# Patient Record
Sex: Male | Born: 1940
Health system: Southern US, Community
[De-identification: ages and names within clinical notes are randomized; demographics above are authoritative.]

## PROBLEM LIST (undated history)

## (undated) DIAGNOSIS — Z86010 Personal history of colonic polyps: Secondary | ICD-10-CM

## (undated) DIAGNOSIS — K219 Gastro-esophageal reflux disease without esophagitis: Secondary | ICD-10-CM

## (undated) DIAGNOSIS — G4733 Obstructive sleep apnea (adult) (pediatric): Secondary | ICD-10-CM

## (undated) DIAGNOSIS — M1990 Unspecified osteoarthritis, unspecified site: Secondary | ICD-10-CM

## (undated) DIAGNOSIS — E291 Testicular hypofunction: Secondary | ICD-10-CM

## (undated) DIAGNOSIS — B009 Herpesviral infection, unspecified: Secondary | ICD-10-CM

## (undated) DIAGNOSIS — M109 Gout, unspecified: Secondary | ICD-10-CM

## (undated) DIAGNOSIS — R739 Hyperglycemia, unspecified: Secondary | ICD-10-CM

## (undated) DIAGNOSIS — G459 Transient cerebral ischemic attack, unspecified: Secondary | ICD-10-CM

## (undated) DIAGNOSIS — Z8719 Personal history of other diseases of the digestive system: Secondary | ICD-10-CM

## (undated) DIAGNOSIS — E039 Hypothyroidism, unspecified: Secondary | ICD-10-CM

## (undated) DIAGNOSIS — R399 Unspecified symptoms and signs involving the genitourinary system: Secondary | ICD-10-CM

## (undated) DIAGNOSIS — J439 Emphysema, unspecified: Secondary | ICD-10-CM

## (undated) DIAGNOSIS — Z5189 Encounter for other specified aftercare: Secondary | ICD-10-CM

## (undated) DIAGNOSIS — R911 Solitary pulmonary nodule: Secondary | ICD-10-CM

## (undated) HISTORY — DX: Gastro-esophageal reflux disease without esophagitis: K21.9

## (undated) HISTORY — DX: Testicular hypofunction: E29.1

## (undated) HISTORY — DX: Solitary pulmonary nodule: R91.1

## (undated) HISTORY — DX: Hypothyroidism, unspecified: E03.9

## (undated) HISTORY — DX: Herpesviral infection, unspecified: B00.9

## (undated) HISTORY — DX: Obstructive sleep apnea (adult) (pediatric): G47.33

## (undated) HISTORY — DX: Emphysema, unspecified: J43.9

## (undated) HISTORY — DX: Transient cerebral ischemic attack, unspecified: G45.9

## (undated) HISTORY — DX: Encounter for other specified aftercare: Z51.89

## (undated) HISTORY — DX: Hyperglycemia, unspecified: R73.9

## (undated) HISTORY — DX: Unspecified symptoms and signs involving the genitourinary system: R39.9

## (undated) HISTORY — DX: Gout, unspecified: M10.9

## (undated) HISTORY — DX: Unspecified osteoarthritis, unspecified site: M19.90

## (undated) HISTORY — DX: Personal history of other diseases of the digestive system: Z87.19

## (undated) HISTORY — DX: Personal history of colonic polyps: Z86.010

---

## 2014-11-27 LAB — HM COLONOSCOPY

## 2015-02-13 LAB — PULMONARY FUNCTION TEST

## 2015-07-05 LAB — HM DEXA SCAN

## 2016-03-16 HISTORY — PX: HERNIA REPAIR: SHX51

## 2016-06-10 LAB — PSA: PSA: 1.3

## 2016-09-28 LAB — IRON,TIBC AND FERRITIN PANEL
%SAT: 21
Ferritin: 74
Iron: 67
TIBC: 323

## 2016-09-28 LAB — VITAMIN D 25 HYDROXY (VIT D DEFICIENCY, FRACTURES): VIT D 25 HYDROXY: 30

## 2017-02-10 LAB — BASIC METABOLIC PANEL
BUN: 15 (ref 4–21)
Creatinine: 1 (ref <=1.3)
Glucose: 102
Potassium: 4.1 (ref 3.4–5.3)
SODIUM: 139 (ref 137–147)

## 2017-02-10 LAB — HEPATIC FUNCTION PANEL
ALT: 26 (ref 10–40)
AST: 27 (ref 14–40)
Alkaline Phosphatase: 61 (ref 25–125)
Bilirubin, Total: 0.9

## 2017-05-21 LAB — LIPID PANEL
CHOLESTEROL: 159 (ref 0–200)
HDL: 47 (ref 35–70)
LDL Cholesterol: 81
LDL/HDL RATIO: 1.7
Triglycerides: 155 (ref 40–160)

## 2017-05-21 LAB — CBC AND DIFFERENTIAL
HCT: 49 (ref 41–53)
HEMOGLOBIN: 16.5 (ref 13.5–17.5)
PLATELETS: 135 — AB (ref 150–399)
WBC: 5.5

## 2017-05-21 LAB — TSH: TSH: 0.37 — AB (ref <=5.90)

## 2017-10-08 ENCOUNTER — Telehealth: Payer: Self-pay

## 2017-10-08 NOTE — Telephone Encounter (Signed)
I approved this new Pt- his sister is Abe PeopleBarbara Rosetti- Dr. Drue NovelPaz accepting family members.

## 2017-10-19 ENCOUNTER — Ambulatory Visit (INDEPENDENT_AMBULATORY_CARE_PROVIDER_SITE_OTHER): Payer: Medicare HMO | Admitting: Internal Medicine

## 2017-10-19 ENCOUNTER — Encounter: Payer: Self-pay | Admitting: Internal Medicine

## 2017-10-19 VITALS — BP 116/74 | HR 79 | Temp 97.9°F | Resp 16 | Ht 72.0 in | Wt 252.1 lb

## 2017-10-19 DIAGNOSIS — E039 Hypothyroidism, unspecified: Secondary | ICD-10-CM

## 2017-10-19 DIAGNOSIS — R399 Unspecified symptoms and signs involving the genitourinary system: Secondary | ICD-10-CM

## 2017-10-19 DIAGNOSIS — R918 Other nonspecific abnormal finding of lung field: Secondary | ICD-10-CM

## 2017-10-19 DIAGNOSIS — R739 Hyperglycemia, unspecified: Secondary | ICD-10-CM

## 2017-10-19 DIAGNOSIS — I499 Cardiac arrhythmia, unspecified: Secondary | ICD-10-CM | POA: Diagnosis not present

## 2017-10-19 DIAGNOSIS — R079 Chest pain, unspecified: Secondary | ICD-10-CM | POA: Diagnosis not present

## 2017-10-19 DIAGNOSIS — N433 Hydrocele, unspecified: Secondary | ICD-10-CM

## 2017-10-19 LAB — COMPREHENSIVE METABOLIC PANEL
ALBUMIN: 4.2 g/dL (ref 3.5–5.2)
ALK PHOS: 59 U/L (ref 39–117)
ALT: 19 U/L (ref 0–53)
AST: 19 U/L (ref 0–37)
BUN: 19 mg/dL (ref 6–23)
CHLORIDE: 105 meq/L (ref 96–112)
CO2: 30 mEq/L (ref 19–32)
Calcium: 9.4 mg/dL (ref 8.4–10.5)
Creatinine, Ser: 1.04 mg/dL (ref 0.40–1.50)
GFR: 73.97 mL/min (ref >60.00)
Glucose, Bld: 109 mg/dL — ABNORMAL HIGH (ref 70–99)
POTASSIUM: 3.9 meq/L (ref 3.5–5.1)
Sodium: 143 mEq/L (ref 135–145)
Total Bilirubin: 0.9 mg/dL (ref 0.2–1.2)
Total Protein: 6.5 g/dL (ref 6.0–8.3)

## 2017-10-19 LAB — CBC WITH DIFFERENTIAL/PLATELET
BASOS PCT: 0.6 % (ref 0.0–3.0)
Basophils Absolute: 0 10*3/uL (ref 0.0–0.1)
EOS PCT: 1 % (ref 0.0–5.0)
Eosinophils Absolute: 0.1 10*3/uL (ref 0.0–0.7)
HEMATOCRIT: 46.5 % (ref 39.0–52.0)
HEMOGLOBIN: 15.6 g/dL (ref 13.0–17.0)
Lymphocytes Relative: 29.4 % (ref 12.0–46.0)
Lymphs Abs: 1.6 10*3/uL (ref 0.7–4.0)
MCHC: 33.6 g/dL (ref 30.0–36.0)
MCV: 95.4 fl (ref 78.0–100.0)
MONOS PCT: 6.5 % (ref 3.0–12.0)
Monocytes Absolute: 0.3 10*3/uL (ref 0.1–1.0)
Neutro Abs: 3.4 10*3/uL (ref 1.4–7.7)
Neutrophils Relative %: 62.5 % (ref 43.0–77.0)
Platelets: 126 10*3/uL — ABNORMAL LOW (ref 150.0–400.0)
RBC: 4.88 Mil/uL (ref 4.22–5.81)
RDW: 14.6 % (ref 11.5–15.5)
WBC: 5.4 10*3/uL (ref 4.0–10.5)

## 2017-10-19 LAB — HEMOGLOBIN A1C: Hgb A1c MFr Bld: 6.3 % (ref 4.6–6.5)

## 2017-10-19 LAB — TSH: TSH: 0.11 u[IU]/mL — ABNORMAL LOW (ref 0.35–4.50)

## 2017-10-19 NOTE — Patient Instructions (Signed)
GO TO THE LAB : Get the blood work     GO TO THE FRONT DESK Schedule your next appointment for a checkup in 4 weeks  ER if severe chest pain

## 2017-10-19 NOTE — Progress Notes (Signed)
Pre visit review using our clinic review tool, if applicable. No additional management support is needed unless otherwise documented below in the visit note. 

## 2017-10-19 NOTE — Progress Notes (Signed)
Subjective:    Patient ID: Samuel Frey, male    DOB: 06-19-1940, 77 y.o.   MRN: [id number]  DOS:  10/19/2017 Type of visit - description : New patient Interval history: The patient just moved from [place], he has a number of medical problems. We discussed each one of his chronic medical issues.   Review of Systems Currently with no shortness of breath, occasionally has mild DOE. He has chest pain for the last several months, the last time he had chest pain was few days ago when he was loading his truck.  The pain is right-sided, decreased with nitroglycerin. He is a former smoker, denies persistent cough. No nausea, vomiting, diarrhea Has history of LUTS, currently well controlled with medications. Denies anxiety or depression.   Past Medical History:  Diagnosis Date  . Arthritis   . Blood transfusion without reported diagnosis   . Emphysema of lung (HCC)   . GERD (gastroesophageal reflux disease)   . Hx of colonic polyps   . Hx of diverticulitis of colon   . Stroke (HCC)   . Thyroid disease       Social History   Socioeconomic History  . Marital status: Single    Spouse name: Not on file  . Number of children: 0  . Years of education: Not on file  . Highest education level: Not on file  Occupational History  . Occupation: retired, Museum/gallery conservator  . Financial resource strain: Not on file  . Food insecurity:    Worry: Not on file    Inability: Not on file  . Transportation needs:    Medical: Not on file    Non-medical: Not on file  Tobacco Use  . Smoking status: Former Smoker    Last attempt to quit: 10/19/2012    Years since quitting: 5.0  . Smokeless tobacco: Never Used  . Tobacco comment: 1-2 ppd  Substance and Sexual Activity  . Alcohol use: Yes    Comment: Social- 2 drinks per day normally  . Drug use: Never  . Sexual activity: Not Currently  Lifestyle  . Physical activity:    Days per week: Not on file    Minutes per  session: Not on file  . Stress: Not on file  Relationships  . Social connections:    Talks on phone: Not on file    Gets together: Not on file    Attends religious service: Not on file    Active member of club or organization: Not on file    Attends meetings of clubs or organizations: Not on file    Relationship status: Not on file  . Intimate partner violence:    Fear of current or ex partner: Not on file    Emotionally abused: Not on file    Physically abused: Not on file    Forced sexual activity: Not on file  Other Topics Concern  . Not on file  Social History Narrative   Moved from [place] to [place], 09/2017   Sister is Christain [place]      Family History  Problem Relation Age of Onset  . COPD Father   . CAD Maternal Grandfather   . Colon cancer Neg Hx   . Prostate cancer Neg Hx      Allergies as of 10/19/2017   No Known Allergies     Medication List        Accurate as of 10/19/17  6:03 PM. Always use your most recent med  list.          alfuzosin 10 MG 24 hr tablet Commonly known as:  UROXATRAL Take 10 mg by mouth daily with breakfast.   allopurinol 300 MG tablet Commonly known as:  ZYLOPRIM Take 300 mg by mouth daily.   clonazePAM 1 MG tablet Commonly known as:  KLONOPIN Take 1 mg by mouth daily.   clopidogrel 75 MG tablet Commonly known as:  PLAVIX Take 75 mg by mouth daily.   gabapentin 300 MG capsule Commonly known as:  NEURONTIN Take 300 mg by mouth as needed.   levothyroxine 150 MCG tablet Commonly known as:  SYNTHROID, LEVOTHROID Take 150 mcg by mouth daily before breakfast.   pantoprazole 20 MG tablet Commonly known as:  PROTONIX Take 20 mg by mouth 2 (two) times daily before a meal.   simvastatin 40 MG tablet Commonly known as:  ZOCOR Take 40 mg by mouth daily.          Objective:   Physical Exam BP 116/74 (BP Location: Left Arm, Patient Position: Sitting, Cuff Size: Normal)   Pulse 79   Temp 97.9 F (36.6 C) (Oral)   Resp  16   Ht 6' (1.829 m)   Wt 252 lb 2 oz (114.4 kg)   SpO2 98%   BMI 34.19 kg/m  General:   Well developed, NAD, see BMI.  HEENT:  Normocephalic . Face symmetric, atraumatic Lungs:  CTA B Normal respiratory effort, no intercostal retractions, no accessory muscle use. Heart: regular?,  no murmur.  no pretibial edema bilaterally  Abdomen:  Not distended, soft, non-tender. No rebound or rigidity.   GU: scrotal exam showed two testicles, L side of scrotum w/ soft,non-reducible swelling. No inguinal hernia Skin: Not pale. Not jaundice Neurologic:  alert & oriented X3.  Speech normal, gait appropriate for age and unassisted Psych--  Cognition and judgment appear intact.  Cooperative with normal attention span and concentration.  Behavior appropriate. No anxious or depressed appearing.     Assessment & Plan:   Assessment TIA ~ 2017 CP: low risk stress test ~ 05/2017 Gout Hypothyroidism  Hyperlipidemia GERD LUTS: after several intolerances to meds now on Alfuzosin Hypogonadism  PULM: --COPD --Lung nodules  MSK --DJD -- Cervical Spinal stenosis Nasal nodule/polyps (saw ENT, had a endoscopy~ 2-05/2017) L hernia repair ~ 2018, still hurts   Plan: TIA: Remote, on Plavix. Chest pain: Patient has chest pain on and off, right-sided, exertional at times, decreased with nitroglycerin.  Reports he had a low risk stress test recently.  Still has occasional chest pain. On exam, I felt irregular heartbeat, EKG showed sinus rhythm with frequent ectopy.  No A. fib.  No EKG to compare with. Gout: On allopurinol.  No recent events Hypothyroidism: On Synthroid, checking a TSH Hyperlipidemia: On simvastatin, checking labs LUTS: After several trial with different medications, symptoms are currently controlled will alfuzosin. Hypogonadism: Patient reports he stopped HRT about 4 weeks ago, he felt it was not he helping him clinically, did not feel stronger, libido did not change. COPD: No  symptoms currently. History of lung nodules: Reports that he has been getting CAT scans every 6 months, last CAT scan was 08/2017 and he was told he was stable. Scrotal swelling: Had a left hernia repair about a year ago, at the time the scrotum on the left side was swollen.  After the surgery that resolved but has gradually come back.  Exam is confirmatory, suspect hydrocele,  reassess on RTC, consider urology referral. Labs today:  CMP, TSH, CBC, A1c (r/o hyperglycemia) Patient reports he already signed ROI.  Will wait for old records RTC 4 weeks  Today, I spent more than  40  min with the patient: >50% of the time counseling regards all previous med problems, pt gave a detail description of his PMH

## 2017-10-21 MED ORDER — LEVOTHYROXINE SODIUM 125 MCG PO TABS: 125.0000 ug | ORAL_TABLET | Freq: Every day | ORAL | 0 refills | Status: DC

## 2017-10-21 NOTE — Addendum Note (Signed)
Addended byConrad [place]: Sewell Pitner D on: 10/21/2017 02:32 PM   Modules accepted: Orders

## 2017-11-17 ENCOUNTER — Ambulatory Visit (INDEPENDENT_AMBULATORY_CARE_PROVIDER_SITE_OTHER): Payer: Medicare HMO | Admitting: Internal Medicine

## 2017-11-17 ENCOUNTER — Telehealth: Payer: Self-pay

## 2017-11-17 ENCOUNTER — Encounter: Payer: Self-pay | Admitting: Internal Medicine

## 2017-11-17 VITALS — BP 121/68 | HR 93 | Temp 98.4°F | Resp 16 | Ht 72.0 in | Wt 258.0 lb

## 2017-11-17 DIAGNOSIS — M1990 Unspecified osteoarthritis, unspecified site: Secondary | ICD-10-CM

## 2017-11-17 DIAGNOSIS — G459 Transient cerebral ischemic attack, unspecified: Secondary | ICD-10-CM

## 2017-11-17 DIAGNOSIS — R739 Hyperglycemia, unspecified: Secondary | ICD-10-CM | POA: Diagnosis not present

## 2017-11-17 DIAGNOSIS — Z09 Encounter for follow-up examination after completed treatment for conditions other than malignant neoplasm: Secondary | ICD-10-CM | POA: Insufficient documentation

## 2017-11-17 DIAGNOSIS — E039 Hypothyroidism, unspecified: Secondary | ICD-10-CM

## 2017-11-17 DIAGNOSIS — J439 Emphysema, unspecified: Secondary | ICD-10-CM

## 2017-11-17 DIAGNOSIS — G2581 Restless legs syndrome: Secondary | ICD-10-CM

## 2017-11-17 DIAGNOSIS — R399 Unspecified symptoms and signs involving the genitourinary system: Secondary | ICD-10-CM

## 2017-11-17 LAB — TSH: TSH: 0.25 u[IU]/mL — ABNORMAL LOW (ref 0.35–4.50)

## 2017-11-17 MED ORDER — CLONAZEPAM 1 MG PO TABS: 1.0000 mg | ORAL_TABLET | Freq: Every day | ORAL | 1 refills | Status: DC

## 2017-11-17 NOTE — Assessment & Plan Note (Signed)
Prediabetes: Diagnosed since the last visit with A1c of 6.3.  Patient aware of diagnosis, we discussed diet and exercise Hypothyroidism: Small adjustment to dose made 4 weeks ago, is a little early but will check a TSH Blister, lip as described above: Does not sound like herpes, cyst?  Recommend to get established with a dentist. RLS: Reports he takes clonazepam as needed, RF sent Records are still pending, new ROI obtained. Scrotal swelling: Reassess on RTC RTC 3 months

## 2017-11-17 NOTE — Telephone Encounter (Signed)
Noted, thx.
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
## 2017-11-17 NOTE — Progress Notes (Signed)
Subjective:    Patient ID: Samuel Frey, male    DOB: 1940/07/18, 77 y.o.   MRN: [id number]  DOS:  11/17/2017 Type of visit - description : f/u Interval history: Since the last office visit he is doing well. Good compliance with medication Has had on and off problem on the inner aspect of the lower lip since he had dental work few months ago.  Every 10 days he develops a blisterlike lesion.  Is a single blister, not really painful but it bothers him when it rubs against the teeth.  patient thinks is related to the dental work he had. Also reports today that he has RLS and request a refill for clonazepam  Review of Systems  Occasional chest pain, mild, at baseline   Past Medical History:  Diagnosis Date  . Arthritis   . Blood transfusion without reported diagnosis   . Emphysema of lung (HCC)   . GERD (gastroesophageal reflux disease)   . Hx of colonic polyps   . Hx of diverticulitis of colon   . Hyperglycemia   . Hypothyroidism   . Lower urinary tract symptoms (LUTS)   . TIA (transient ischemic attack)     Past Surgical History:  Procedure Laterality Date  . HERNIA REPAIR Left 2018    Social History   Socioeconomic History  . Marital status: Single    Spouse name: Not on file  . Number of children: 0  . Years of education: Not on file  . Highest education level: Not on file  Occupational History  . Occupation: retired, Museum/gallery conservator  . Financial resource strain: Not on file  . Food insecurity:    Worry: Not on file    Inability: Not on file  . Transportation needs:    Medical: Not on file    Non-medical: Not on file  Tobacco Use  . Smoking status: Former Smoker    Last attempt to quit: 10/19/2012    Years since quitting: 5.0  . Smokeless tobacco: Never Used  . Tobacco comment: 1-2 ppd  Substance and Sexual Activity  . Alcohol use: Yes    Comment: Social- 2 drinks per day normally  . Drug use: Never  . Sexual activity: Not Currently    Lifestyle  . Physical activity:    Days per week: Not on file    Minutes per session: Not on file  . Stress: Not on file  Relationships  . Social connections:    Talks on phone: Not on file    Gets together: Not on file    Attends religious service: Not on file    Active member of club or organization: Not on file    Attends meetings of clubs or organizations: Not on file    Relationship status: Not on file  . Intimate partner violence:    Fear of current or ex partner: Not on file    Emotionally abused: Not on file    Physically abused: Not on file    Forced sexual activity: Not on file  Other Topics Concern  . Not on file  Social History Narrative   Moved from [place] to [place], 09/2017   Sister is Christain [place]       Allergies as of 11/17/2017   No Known Allergies     Medication List        Accurate as of 11/17/17  9:34 PM. Always use your most recent med list.  alfuzosin 10 MG 24 hr tablet Commonly known as:  UROXATRAL Take 10 mg by mouth daily with breakfast.   allopurinol 300 MG tablet Commonly known as:  ZYLOPRIM Take 300 mg by mouth daily.   clonazePAM 1 MG tablet Commonly known as:  KLONOPIN Take 1 tablet (1 mg total) by mouth daily.   clopidogrel 75 MG tablet Commonly known as:  PLAVIX Take 75 mg by mouth daily.   gabapentin 300 MG capsule Commonly known as:  NEURONTIN Take 300 mg by mouth as needed.   levothyroxine 125 MCG tablet Commonly known as:  SYNTHROID, LEVOTHROID Take 1 tablet (125 mcg total) by mouth daily before breakfast.   pantoprazole 20 MG tablet Commonly known as:  PROTONIX Take 20 mg by mouth 2 (two) times daily before a meal.   simvastatin 40 MG tablet Commonly known as:  ZOCOR Take 40 mg by mouth daily.          Objective:   Physical Exam BP 121/68 (BP Location: Left Arm, Patient Position: Sitting, Cuff Size: Normal)   Pulse 93   Temp 98.4 F (36.9 C) (Oral)   Resp 16   Ht 6' (1.829 m)   Wt 258 lb  (117 kg)   SpO2 96%   BMI 34.99 kg/m  General:   Well developed, NAD, see BMI.  HEENT:  Normocephalic . Face symmetric, atraumatic.   Lower inner lip normal to inspection and palpation. Lungs:  CTA B Normal respiratory effort, no intercostal retractions, no accessory muscle use. Heart: RRR,  no murmur.  No pretibial edema bilaterally  Skin: Not pale. Not jaundice Neurologic:  alert & oriented X3.  Speech normal, gait appropriate for age and unassisted Psych--  Cognition and judgment appear intact.  Cooperative with normal attention span and concentration.  Behavior appropriate. No anxious or depressed appearing.       Assessment & Plan:   Assessment  New pt 10/19/17, referred by B. Rossetti Hyperglycemia: A1C 6.3 (10/2017) TIA ~ 2017 CP: low risk stress test ~ 05/2017 Gout Hypothyroidism  Hyperlipidemia RLS - on clonazepam prn GERD LUTS: after several intolerances to meds now on Alfuzosin Hypogonadism  PULM: --COPD --Lung nodules  MSK --DJD -- Cervical Spinal stenosis Nasal nodule/polyps (saw ENT, had a endoscopy~ 2-05/2017) L hernia repair ~ 2018, still hurts   Plan: Prediabetes: Diagnosed since the last visit with A1c of 6.3.  Patient aware of diagnosis, we discussed diet and exercise Hypothyroidism: Small adjustment to dose made 4 weeks ago, is a little early but will check a TSH Blister, lip as described above: Does not sound like herpes, cyst?  Recommend to get established with a dentist. RLS: Reports he takes clonazepam as needed, RF sent Records are still pending, new ROI obtained. Scrotal swelling: Reassess on RTC RTC 3 months

## 2017-11-17 NOTE — Patient Instructions (Signed)
GO TO THE LAB : Get the blood work     GO TO THE FRONT DESK Schedule your next appointment for a checkup in 3 months  Please get established with a dentist

## 2017-11-17 NOTE — Progress Notes (Signed)
Pre visit review using our clinic review tool, if applicable. No additional management support is needed unless otherwise documented below in the visit note. 

## 2017-11-19 MED ORDER — LEVOTHYROXINE SODIUM 112 MCG PO TABS: 112.0000 ug | ORAL_TABLET | Freq: Every day | ORAL | 1 refills | Status: DC

## 2017-11-19 NOTE — Telephone Encounter (Signed)
100s of pages, will review in the near future

## 2017-11-19 NOTE — Addendum Note (Signed)
Addended byConrad [place] D on: 11/19/2017 02:46 PM   Modules accepted: Orders

## 2017-11-19 NOTE — Telephone Encounter (Signed)
Records received- placed in MD red folder.  

## 2017-11-28 ENCOUNTER — Other Ambulatory Visit: Payer: Self-pay | Admitting: Internal Medicine

## 2017-12-01 ENCOUNTER — Other Ambulatory Visit: Payer: Self-pay | Admitting: Internal Medicine

## 2017-12-01 MED ORDER — PANTOPRAZOLE SODIUM 20 MG PO TBEC: 20.0000 mg | DELAYED_RELEASE_TABLET | Freq: Two times a day (BID) | ORAL | 3 refills | Status: DC

## 2017-12-01 NOTE — Telephone Encounter (Signed)
Copied from CRM [phone]. Topic: Quick Communication - Rx Refill/Question >> Dec 01, 2017  3:57 PM Arlyss Gandyichardson, Henrick Mcgue N, NT wrote: Medication: pantoprazole (PROTONIX) 20 MG tablet   Has the patient contacted their pharmacy? Yes.   (Agent: If no, request that the patient contact the pharmacy for the refill.) (Agent: If yes, when and what did the pharmacy advise?)  Preferred Pharmacy (with phone number or street name): [id number]- [place], [place] - [address] [phone] (Phone) [phone] (Fax)    Agent: Please be advised that RX refills may take up to 3 business days. We ask that you follow-up with your pharmacy.

## 2017-12-01 NOTE — Telephone Encounter (Signed)
Rx sent 

## 2017-12-14 ENCOUNTER — Telehealth: Payer: Self-pay

## 2017-12-14 ENCOUNTER — Encounter: Payer: Self-pay | Admitting: Internal Medicine

## 2017-12-14 NOTE — Telephone Encounter (Addendum)
More than 200 pages reviewed.  We will most relevant ones ; others back  to the patient for safe keeping.  Colonoscopy 11/27/2014: Internal hemorrhoids, 2 polyps, 8 mm.  Biopsy results?  PFTs: 2016, +4 small airway obstruction, mildly response to bronchodilators. DEXA scan 07/05/2015: T score -2.6  Office visit 10/03/2015: TIA work-up included "negative MRI of the brain, echocardiogram with EF of 65% and concentric LVH, carotid duplex 20-30% bilateral ICA. Patient was changed from aspirin to Plavix and from omeprazole to pantoprazole"  CT abdomen 07/15/2016 rx for RLQ abd pain: Diverticulosis, normal appendix, nonobstructing renal calculi  06/10/2016 PSA 1.3   07/15/2016:   continuous oximetry results qualified patient for nocturnal oxygen Had a repeated study 03/25/2016 and again he qualify for nocturnal oxygen. 08/24/16 sleep study with severe OSA hypercapnia syndrome, recommended CPAP  CT chest 09-2016: - Nodular opacity in the right lung unchange compared to previous examination a year ago --Modest interstitial fibrosis and bronchiectasis --Coronary artery calcification  03/18/2017, myocardial perfusion scan: Nonischemic    Office visit 02/19/2017: Reportedly has a history of his sleep apnea, intolerant to CPAP.

## 2017-12-14 NOTE — Telephone Encounter (Signed)
Records sent for scanning

## 2017-12-14 NOTE — Telephone Encounter (Signed)
PA initiated via Covermymeds; KEY: [id number]. Awaiting determination.

## 2017-12-14 NOTE — Telephone Encounter (Signed)
PA approved. Effective 03/16/2017 to 03/15/2018.

## 2017-12-22 DIAGNOSIS — R69 Illness, unspecified: Secondary | ICD-10-CM | POA: Diagnosis not present

## 2018-01-10 ENCOUNTER — Telehealth: Payer: Self-pay | Admitting: Internal Medicine

## 2018-01-10 NOTE — Telephone Encounter (Signed)
Copied from CRM [id number]. Topic: Quick Communication - Rx Refill/Question >> Jan 10, 2018  3:12 PM Lyn Hollingshead, [facility] wrote: Medication:  alfuzosin (UROXATRAL) 10 MG 24 hr tablet   allopurinol (ZYLOPRIM) 300 MG tablet  clonazePAM (KLONOPIN) 1 MG tablet  clopidogrel (PLAVIX) 75 MG tablet  levothyroxine (SYNTHROID, LEVOTHROID) 112 MCG tablet  pantoprazole (PROTONIX) 20 MG tablet  simvastatin (ZOCOR) 40 MG tablet   **Protonix:  Pt states that he takes 40mg  in the morning and 20mg  at night  Has the patient contacted their pharmacy? No - new pharmacy here (Agent: If no, request that the patient contact the pharmacy for the refill.) (Agent: If yes, when and what did the pharmacy advise?)  Preferred Pharmacy (with phone number or street name): [id number], [place] - [address] [phone] (Phone) [phone] (Fax)  Agent: Please be advised that RX refills may take up to 3 business days. We ask that you follow-up with your pharmacy.

## 2018-01-11 MED ORDER — SIMVASTATIN 40 MG PO TABS: 40.0000 mg | ORAL_TABLET | Freq: Every day | ORAL | 1 refills | Status: DC

## 2018-01-11 MED ORDER — ALFUZOSIN HCL ER 10 MG PO TB24: 10.0000 mg | ORAL_TABLET | Freq: Every day | ORAL | 1 refills | Status: DC

## 2018-01-11 MED ORDER — PANTOPRAZOLE SODIUM 20 MG PO TBEC: 20.0000 mg | DELAYED_RELEASE_TABLET | Freq: Two times a day (BID) | ORAL | 3 refills | Status: DC

## 2018-01-11 MED ORDER — CLONAZEPAM 1 MG PO TABS: 1.0000 mg | ORAL_TABLET | Freq: Every day | ORAL | 1 refills | Status: DC

## 2018-01-11 MED ORDER — ALLOPURINOL 300 MG PO TABS: 300.0000 mg | ORAL_TABLET | Freq: Every day | ORAL | 1 refills | Status: DC

## 2018-01-11 MED ORDER — LEVOTHYROXINE SODIUM 112 MCG PO TABS: 112.0000 ug | ORAL_TABLET | Freq: Every day | ORAL | 1 refills | Status: DC

## 2018-01-11 MED ORDER — CLOPIDOGREL BISULFATE 75 MG PO TABS: 75.0000 mg | ORAL_TABLET | Freq: Every day | ORAL | 1 refills | Status: DC

## 2018-01-11 NOTE — Telephone Encounter (Signed)
Pt is requesting refill on clonazepam.   Last OV: 11/17/2017 Last Fill: 11/17/2017 #30 and 1RF UDS: None  NEEDS UDS AND CONTRACT AT NEXT OV  NCCR printed- no discrepancies noted- sent for scanning.

## 2018-01-11 NOTE — Telephone Encounter (Signed)
Requested medication (s) are due for refill today: yes  Requested medication (s) are on the active medication list: yes  Last refill: 2 months ago  Future visit scheduled: yes  Notes to clinic:  Clonazepam: undelegated                           Alfuzosin, Allopurinol, Clopidigrel: written by historical provider   Requested Prescriptions  Pending Prescriptions Disp Refills   alfuzosin (UROXATRAL) 10 MG 24 hr tablet 90 tablet 0    Sig: Take 1 tablet (10 mg total) by mouth daily with breakfast.     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     Not Delegated - Psychiatry:  Anxiolytics/Hypnotics Failed - 01/11/2018  7:20 AM      Failed - This refill cannot be delegated      Failed - Urine Drug Screen completed in last 360 days.      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     Hematology: Antiplatelets - clopidogrel Failed - 01/11/2018  7:20 AM      Failed - Evaluate AST, ALT within 2 months of therapy initiation.      Failed - PLT in normal range and within 180 days    Platelets  Date Value Ref Range Status  10/19/2017 126.0 (L) 150.0 - 400.0 K/uL Final         Passed - ALT in normal range and within 360 days    ALT  Date Value Ref Range Status  10/19/2017 19 0 - 53 U/L Final         Passed - AST in normal range and within 360 days    AST  Date Value Ref Range Status  10/19/2017 19 0 - 37 U/L Final         Passed - HCT in normal range and within 180 days    HCT  Date Value Ref Range Status  10/19/2017 46.5 39.0 - 52.0 % Final         Passed - HGB in normal range and within 180 days    Hemoglobin  Date Value Ref Range Status  10/19/2017 15.6 13.0 - 17.0 g/dL Final         Passed - Valid encounter within last 6 months    Recent Outpatient Visits          1 month ago Hypothyroidism, unspecified type   Water quality scientist at [employer], [place]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 [place], [place] E, MD      Future Appointments            In 1 month Paz, Nolon Rod, MD [facility], [facility]          levothyroxine (SYNTHROID, LEVOTHROID) 112 MCG tablet 90 tablet 0    Sig: Take 1 tablet (112 mcg total) by mouth daily before breakfast.     Endocrinology:  Hypothyroid Agents Failed - 01/11/2018  7:20 AM      Failed - TSH needs to be rechecked within 3 months after an abnormal result. Refill until TSH is due.      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     Cardiovascular:  Antilipid - Statins Passed - 01/11/2018  7:20 AM      Passed - Total Cholesterol in normal range and within 360 days    Cholesterol  Date Value Ref Range Status  05/21/2017 159 0 - 200 Final         Passed - LDL in normal range and within 360 days    LDL Cholesterol  Date Value Ref Range Status  05/21/2017 81  Final         Passed - HDL in normal range and within 360 days    HDL  Date Value Ref Range Status  05/21/2017 47 35 - 70 Final         Passed - Triglycerides in normal range and within 360 days    Triglycerides  Date Value Ref Range Status  05/21/2017 155 40 - 160  Final         Passed - Patient is not pregnant      Passed - Valid encounter within last 12 months    Recent Outpatient Visits          1 month ago Hypothyroidism, unspecified type   Holiday representative at [employer], [place]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

## 2018-01-11 NOTE — Telephone Encounter (Signed)
Sent!

## 2018-01-11 NOTE — Addendum Note (Signed)
Addended by: Willow Ora E on: 01/11/2018 02:35 PM   Modules accepted: Orders

## 2018-01-11 NOTE — Telephone Encounter (Signed)
Requested Prescriptions  Pending Prescriptions Disp Refills  . alfuzosin (UROXATRAL) 10 MG 24 hr tablet      Sig: Take 1 tablet (10 mg total) by mouth daily with breakfast.     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 allopurinol (ZYLOPRIM) 300 MG tablet      Sig: Take 1 tablet (300 mg total) by mouth daily.     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 clonazePAM (KLONOPIN) 1 MG tablet 30 tablet 1    Sig: Take 1 tablet (1 mg total) by mouth daily.     Not Delegated - Psychiatry:  Anxiolytics/Hypnotics Failed - 01/11/2018  7:20 AM      Failed - This refill cannot be delegated      Failed - Urine Drug  Screen completed in last 360 days.      Passed - Valid encounter within last 6 months    Recent Outpatient Visits          1 month ago Hypothyroidism, unspecified type   Holiday representative at [employer], [place] E, MD   2 months ago Irregular heart beat   [employer] at [facility] [place], Nolon Rod, MD      Future Appointments            In 1 month Drue Novel, Nolon Rod, MD [employer] at [facility], [place]         . clopidogrel (PLAVIX) 75 MG tablet      Sig: Take 1 tablet (75 mg total) by mouth daily.     Hematology: Antiplatelets -  clopidogrel Failed - 01/11/2018  7:20 AM      Failed - Evaluate AST, ALT within 2 months of therapy initiation.      Failed - PLT in normal range and within 180 days    Platelets  Date Value Ref Range Status  10/19/2017 126.0 (L) 150.0 - 400.0 K/uL Final         Passed - ALT in normal range and within 360 days    ALT  Date Value Ref Range Status  10/19/2017 19 0 - 53 U/L Final         Passed - AST in normal range and within 360 days    AST  Date Value Ref Range Status  10/19/2017 19 0 - 37 U/L Final         Passed - HCT in normal range and within 180 days    HCT  Date Value Ref Range Status  10/19/2017 46.5 39.0 - 52.0 % Final         Passed - HGB in normal range and within 180 days    Hemoglobin  Date Value Ref Range Status  10/19/2017 15.6 13.0 - 17.0 g/dL Final         Passed - Valid encounter within last 6 months    Recent Outpatient Visits          1 month ago Hypothyroidism, unspecified type   Holiday representative at [employer], [place] E, MD   2 months ago Irregular heart beat   [employer] at [facility] [place], [place] E, MD      Future Appointments            In 1 month Paz, Nolon Rod, MD [facility], [facility]         . levothyroxine (SYNTHROID, LEVOTHROID) 112 MCG tablet 90  tablet 0    Sig: Take 1 tablet (112 mcg total) by mouth daily before breakfast.     Endocrinology:  Hypothyroid Agents Failed - 01/11/2018  7:20 AM      Failed - TSH needs to be rechecked within 3 months after an abnormal result. Refill until TSH is due.      Failed - TSH in normal range and within 360 days    TSH  Date Value Ref Range Status  11/17/2017 0.25 (L) 0.35 - 4.50 uIU/mL Final         Passed - Valid encounter within last 12 months    Recent Outpatient Visits          1 month ago Hypothyroidism, unspecified type   Holiday representative at [employer], [place] E, MD   2 months ago Irregular heart beat   [employer] at [employer], Nolon Rod, MD      Future Appointments            In 1 month Paz, Nolon Rod, MD [employer] at [facility], [place]         . simvastatin (ZOCOR) 40 MG tablet 90 tablet 0    Sig: Take 1 tablet (40 mg total) by mouth daily.     Cardiovascular:  Antilipid - Statins Passed - 01/11/2018  7:20 AM      Passed - Total Cholesterol in normal range and within 360 days    Cholesterol  Date Value Ref Range Status  05/21/2017 159 0 - 200 Final  Passed - LDL in normal range and within 360 days    LDL Cholesterol  Date Value Ref Range Status  05/21/2017 81  Final         Passed - HDL in normal range and within 360 days    HDL  Date Value Ref Range Status  05/21/2017 47 35 - 70 Final         Passed - Triglycerides in normal range and within 360 days    Triglycerides  Date Value Ref Range Status  05/21/2017 155 40 - 160 Final         Passed - Patient is not pregnant      Passed - Valid encounter within last 12 months    Recent Outpatient Visits          1 month ago Hypothyroidism, unspecified type   Holiday representative at [employer], [place] E, MD   2 months ago Irregular heart beat   [employer] at [facility]  [place], [place] E, MD      Future Appointments            In 1 month Paz, Nolon Rod, MD [facility], [place]         Signed Prescriptions Disp Refills   pantoprazole (PROTONIX) 20 MG tablet 180 tablet 3    Sig: Take 1 tablet (20 mg total) by mouth 2 (two) times daily before a meal.     Gastroenterology: Proton Pump Inhibitors Passed - 01/11/2018  7:20 AM      Passed - Valid encounter within last 12 months    Recent Outpatient Visits          1 month ago Hypothyroidism, unspecified type   Holiday representative at [employer], [place] E, MD   2 months ago Irregular heart beat   [employer] at [employer], Nolon Rod, MD      Future Appointments            In 1 month Paz, Nolon Rod, MD [employer] at [facility], [place]

## 2018-01-11 NOTE — Telephone Encounter (Signed)
Requested medication (s) are due for refill today: yes  Requested medication (s) are on the active medication list: yes  Last refill:  11/17/17  Future visit scheduled: yes  Notes to clinic:  Med not delegated. Pt needs urine drug screen.    Requested Prescriptions  Pending Prescriptions Disp Refills   clonazePAM (KLONOPIN) 1 MG tablet 30 tablet 1    Sig: Take 1 tablet (1 mg total) by mouth daily.     Not Delegated - Psychiatry:  Anxiolytics/Hypnotics Failed - 01/11/2018  1:26 PM      Failed - This refill cannot be delegated      Failed - Urine Drug Screen completed in last 360 days.      Passed - Valid encounter within last 6 months    Recent Outpatient Visits          1 month ago Hypothyroidism, unspecified type   Holiday representative at [facility] [place], [place] E, MD   2 months ago Irregular heart beat   [employer] at [facility] [place], [place] E, MD      Future Appointments            In 1 month Wanda Plump, MD [facility], [place]         Signed Prescriptions Disp Refills   alfuzosin (UROXATRAL) 10 MG 24 hr tablet 90 tablet 1    Sig: Take 1 tablet (10 mg total) by mouth daily with breakfast.     Urology: Alpha-Adrenergic Blocker Passed - 01/11/2018  7:20 AM      Passed - Last BP in normal range    BP Readings from Last 1 Encounters:  11/17/17 121/68         Passed - Valid encounter within last 12 months    Recent Outpatient Visits          1 month ago Hypothyroidism, unspecified type   Holiday representative at [employer], [place] E, MD   2 months ago Irregular heart beat   [employer] at [facility] [place], [place] E, MD      Future Appointments            In 1 month Paz, Nolon Rod, MD [facility]          allopurinol (ZYLOPRIM) 300 MG tablet 90 tablet 1    Sig: Take 1 tablet (300 mg  total) by mouth daily.     Endocrinology:  Gout Agents Failed - 01/11/2018  7:20 AM      Failed - Uric Acid in normal range and within 360 days    No results found for: POCURA, LABURIC       Passed - Cr in normal range and within 360 days    Creatinine, Ser  Date Value Ref Range Status  10/19/2017 1.04 0.40 - 1.50 mg/dL Final         Passed - Valid encounter within last 12 months    Recent Outpatient Visits          1 month ago Hypothyroidism, unspecified type   Holiday representative at [employer], [place] E, MD   2 months ago Irregular heart beat   [employer] at [facility], Nolon Rod, MD      Future Appointments  In 1 month Paz, Nolon Rod, MD [facility], [facility]          clopidogrel (PLAVIX) 75 MG tablet 90 tablet 1    Sig: Take 1 tablet (75 mg total) by mouth daily.     Hematology: Antiplatelets - clopidogrel Failed - 01/11/2018  7:20 AM      Failed - Evaluate AST, ALT within 2 months of therapy initiation.      Failed - PLT in normal range and within 180 days    Platelets  Date Value Ref Range Status  10/19/2017 126.0 (L) 150.0 - 400.0 K/uL Final         Passed - ALT in normal range and within 360 days    ALT  Date Value Ref Range Status  10/19/2017 19 0 - 53 U/L Final         Passed - AST in normal range and within 360 days    AST  Date Value Ref Range Status  10/19/2017 19 0 - 37 U/L Final         Passed - HCT in normal range and within 180 days    HCT  Date Value Ref Range Status  10/19/2017 46.5 39.0 - 52.0 % Final         Passed - HGB in normal range and within 180 days    Hemoglobin  Date Value Ref Range Status  10/19/2017 15.6 13.0 - 17.0 g/dL Final         Passed - Valid encounter within last 6 months    Recent Outpatient Visits          1 month ago Hypothyroidism, unspecified type   Holiday representative at [employer],  [place] E, MD   2 months ago Irregular heart beat   [employer] at [facility] [place], [place] E, MD      Future Appointments            In 1 month Paz, Nolon Rod, MD [facility], [facility]          levothyroxine (SYNTHROID, LEVOTHROID) 112 MCG tablet 90 tablet 1    Sig: Take 1 tablet (112 mcg total) by mouth daily before breakfast.     Endocrinology:  Hypothyroid Agents Failed - 01/11/2018  7:20 AM      Failed - TSH needs to be rechecked within 3 months after an abnormal result. Refill until TSH is due.      Failed - TSH in normal range and within 360 days    TSH  Date Value Ref Range Status  11/17/2017 0.25 (L) 0.35 - 4.50 uIU/mL Final         Passed - Valid encounter within last 12 months    Recent Outpatient Visits          1 month ago Hypothyroidism, unspecified type   Holiday representative at [employer], [place] E, MD   2 months ago Irregular heart beat   [employer] at [facility] [place], Nolon Rod, MD      Future Appointments            In 1 month Paz, Nolon Rod, MD [facility]          pantoprazole (PROTONIX) 20 MG tablet 180 tablet 3    Sig: Take 1 tablet (20 mg  total) by mouth 2 (two) times daily before a meal.     Gastroenterology: Proton Pump Inhibitors Passed - 01/11/2018  7:20 AM      Passed - Valid encounter within last 12 months    Recent Outpatient Visits          1 month ago Hypothyroidism, unspecified type   Holiday representative at [facility] [place], [place] E, MD   2 months ago Irregular heart beat   [employer] at [facility] [place], [place] E, MD      Future Appointments            In 1 month Paz, Nolon Rod, MD [facility]          simvastatin (ZOCOR) 40 MG tablet 90 tablet 1    Sig: Take 1 tablet (40 mg total) by mouth  daily.     Cardiovascular:  Antilipid - Statins Passed - 01/11/2018  7:20 AM      Passed - Total Cholesterol in normal range and within 360 days    Cholesterol  Date Value Ref Range Status  05/21/2017 159 0 - 200 Final         Passed - LDL in normal range and within 360 days    LDL Cholesterol  Date Value Ref Range Status  05/21/2017 81  Final         Passed - HDL in normal range and within 360 days    HDL  Date Value Ref Range Status  05/21/2017 47 35 - 70 Final         Passed - Triglycerides in normal range and within 360 days    Triglycerides  Date Value Ref Range Status  05/21/2017 155 40 - 160 Final         Passed - Patient is not pregnant      Passed - Valid encounter within last 12 months    Recent Outpatient Visits          1 month ago Hypothyroidism, unspecified type   Holiday representative at [employer], [place] E, MD   2 months ago Irregular heart beat   [employer] at [employer], Nolon Rod, MD      Future Appointments            In 1 month Paz, Nolon Rod, MD [employer] [facility] [employer], [facility]

## 2018-01-11 NOTE — Telephone Encounter (Signed)
Requested Prescriptions  Pending Prescriptions Disp Refills  . alfuzosin (UROXATRAL) 10 MG 24 hr tablet      Sig: Take 1 tablet (10 mg total) by mouth daily with breakfast.     Urology: Alpha-Adrenergic Blocker Passed - 01/11/2018  7:20 AM      Passed - Last BP in normal range    BP Readings from Last 1 Encounters:  11/17/17 121/68         Passed - Valid encounter within last 12 months    Recent Outpatient Visits          1 month ago Hypothyroidism, unspecified type   [facility] at [facility], [place] E, MD   2 months ago Irregular heart beat   [employer], Nolon Rod, MD      Future Appointments            In 1 month Paz, Nolon Rod, MD [employer] [facility] at [facility], [place]         . allopurinol (ZYLOPRIM) 300 MG tablet      Sig: Take 1 tablet (300 mg total) by mouth daily.     Endocrinology:  Gout Agents Failed - 01/11/2018  7:20 AM      Failed - Uric Acid in normal range and within 360 days    No results found for: POCURA, LABURIC       Passed - Cr in normal range and within 360 days    Creatinine, Ser  Date Value Ref Range Status  10/19/2017 1.04 0.40 - 1.50 mg/dL Final         Passed - Valid encounter within last 12 months    Recent Outpatient Visits          1 month ago Hypothyroidism, unspecified type   Holiday representative at [facility], [place] E, MD   2 months ago Irregular heart beat   [facility] at [facility], Nolon Rod, MD      Future Appointments            In 1 month Paz, Nolon Rod, MD [employer] [facility] at [facility], [place]         . clonazePAM (KLONOPIN) 1 MG tablet 30 tablet 1    Sig: Take 1 tablet (1 mg total) by mouth daily.     Not Delegated - Psychiatry:  Anxiolytics/Hypnotics Failed - 01/11/2018  7:20 AM      Failed - This refill cannot be delegated      Failed - Urine Drug  Screen completed in last 360 days.      Passed - Valid encounter within last 6 months    Recent Outpatient Visits          1 month ago Hypothyroidism, unspecified type   Holiday representative at [facility], [place] E, MD   2 months ago Irregular heart beat   [employer] at [facility], Nolon Rod, MD      Future Appointments            In 1 month Drue Novel, Nolon Rod, MD [employer] [facility] at [facility], [place]         . clopidogrel (PLAVIX) 75 MG tablet      Sig: Take 1 tablet (75 mg total) by mouth daily.     Hematology: Antiplatelets -  clopidogrel Failed - 01/11/2018  7:20 AM      Failed - Evaluate AST, ALT within 2 months of therapy initiation.      Failed - PLT in normal range and within 180 days    Platelets  Date Value Ref Range Status  10/19/2017 126.0 (L) 150.0 - 400.0 K/uL Final         Passed - ALT in normal range and within 360 days    ALT  Date Value Ref Range Status  10/19/2017 19 0 - 53 U/L Final         Passed - AST in normal range and within 360 days    AST  Date Value Ref Range Status  10/19/2017 19 0 - 37 U/L Final         Passed - HCT in normal range and within 180 days    HCT  Date Value Ref Range Status  10/19/2017 46.5 39.0 - 52.0 % Final         Passed - HGB in normal range and within 180 days    Hemoglobin  Date Value Ref Range Status  10/19/2017 15.6 13.0 - 17.0 g/dL Final         Passed - Valid encounter within last 6 months    Recent Outpatient Visits          1 month ago Hypothyroidism, unspecified type   Holiday representative at [facility], [place] E, MD   2 months ago Irregular heart beat   [employer] at [facility], [place] E, MD      Future Appointments            In 1 month Paz, Nolon Rod, MD [facility] at [facility], [facility]         . levothyroxine (SYNTHROID, LEVOTHROID) 112 MCG tablet 90  tablet 0    Sig: Take 1 tablet (112 mcg total) by mouth daily before breakfast.     Endocrinology:  Hypothyroid Agents Failed - 01/11/2018  7:20 AM      Failed - TSH needs to be rechecked within 3 months after an abnormal result. Refill until TSH is due.      Failed - TSH in normal range and within 360 days    TSH  Date Value Ref Range Status  11/17/2017 0.25 (L) 0.35 - 4.50 uIU/mL Final         Passed - Valid encounter within last 12 months    Recent Outpatient Visits          1 month ago Hypothyroidism, unspecified type   Holiday representative at [facility], [place] E, MD   2 months ago Irregular heart beat   [employer] at [facility], Nolon Rod, MD      Future Appointments            In 1 month Paz, Nolon Rod, MD [employer] at [facility], [place]         . pantoprazole (PROTONIX) 20 MG tablet 180 tablet 3    Sig: Take 1 tablet (20 mg total) by mouth 2 (two) times daily before a meal.     Gastroenterology: Proton Pump Inhibitors Passed - 01/11/2018  7:20 AM      Passed - Valid encounter within last 12 months    Recent Outpatient Visits          1  month ago Hypothyroidism, unspecified type   Holiday representative at [facility], [place] E, MD   2 months ago Irregular heart beat   [employer] at [facility], Nolon Rod, MD      Future Appointments            In 1 month Drue Novel, Nolon Rod, MD [employer] at [facility], [place]         . simvastatin (ZOCOR) 40 MG tablet 30 tablet     Sig: Take 1 tablet (40 mg total) by mouth daily.     Cardiovascular:  Antilipid - Statins Passed - 01/11/2018  7:20 AM      Passed - Total Cholesterol in normal range and within 360 days    Cholesterol  Date Value Ref Range Status  05/21/2017 159 0 - 200 Final         Passed - LDL in normal range and within 360 days    LDL Cholesterol  Date Value  Ref Range Status  05/21/2017 81  Final         Passed - HDL in normal range and within 360 days    HDL  Date Value Ref Range Status  05/21/2017 47 35 - 70 Final         Passed - Triglycerides in normal range and within 360 days    Triglycerides  Date Value Ref Range Status  05/21/2017 155 40 - 160 Final         Passed - Patient is not pregnant      Passed - Valid encounter within last 12 months    Recent Outpatient Visits          1 month ago Hypothyroidism, unspecified type   Holiday representative at [facility], [place] E, MD   2 months ago Irregular heart beat   [employer] at [facility], Nolon Rod, MD      Future Appointments            In 1 month Paz, Nolon Rod, MD [employer] at [facility], [facility]

## 2018-01-11 NOTE — Addendum Note (Signed)
Addended byConrad Emporia D on: 01/11/2018 01:41 PM   Modules accepted: Orders

## 2018-01-13 ENCOUNTER — Other Ambulatory Visit: Payer: Self-pay | Admitting: Internal Medicine

## 2018-01-21 ENCOUNTER — Telehealth: Payer: Self-pay | Admitting: Internal Medicine

## 2018-01-21 ENCOUNTER — Telehealth: Payer: Self-pay

## 2018-01-21 MED ORDER — FAMOTIDINE 20 MG PO TABS: 20.0000 mg | ORAL_TABLET | Freq: Every day | ORAL | 6 refills | Status: DC

## 2018-01-21 NOTE — Telephone Encounter (Signed)
Copied from CRM #[id number]. Topic: General - Other °>> Jan 21, 2018  2:20 PM Bell, Tiffany M wrote: °Relation to pt: self °Call back number: [phone] °Pharmacy: °[id number] - [place], [place] - [address] [phone] (Phone) °[phone] (Fax) ° ° °Reason for call:  °Patient requesting pantoprazole (PROTONIX) 20 MG 1 tab at night and 40 MG 1 tab in the morning 90 day supply, please advise patient when Rx is sent. Patient states he's been taking this medication for 10 years. °

## 2018-01-21 NOTE — Telephone Encounter (Signed)
Rx sent 

## 2018-01-21 NOTE — Telephone Encounter (Signed)
Duplicate request. Sent to PCP for review.

## 2018-01-21 NOTE — Telephone Encounter (Signed)
Copied from CRM [id number]. Topic: General - Other >> Jan 21, 2018  2:20 PM Elliot Gault wrote: Relation to pt: self Call back number: [phone] Pharmacy: [id number] - [place], [place] - [address] [phone] (Phone) [phone] (Fax)   Reason for call:  Patient requesting pantoprazole (PROTONIX) 20 MG 1 tab at night and 40 MG 1 tab in the morning 90 day supply, please advise patient when Rx is sent. Patient states he's been taking this medication for 10 years.

## 2018-01-21 NOTE — Telephone Encounter (Signed)
Spoke with the patient, Protonix 60 mg daily is a high dose.  He reports he is afraid of taking less. We eventually agreed on: Protonix 20 mg 1 p.o. twice daily Add famotidine 20 mg 1 p.o. nightly. Send prescription x 6 months Also alternative to alfuzosin? ($$); previously failed many other medications, I wonder about Myrbetriq but is going to be very expensive as well.  Will discuss more on RTC

## 2018-01-28 ENCOUNTER — Telehealth: Payer: Self-pay

## 2018-01-28 DIAGNOSIS — M722 Plantar fascial fibromatosis: Secondary | ICD-10-CM

## 2018-01-28 NOTE — Telephone Encounter (Signed)
Please advise 

## 2018-01-28 NOTE — Telephone Encounter (Signed)
Referral placed.

## 2018-01-28 NOTE — Telephone Encounter (Signed)
Copied from CRM [id number]. Topic: Referral - Request for Referral >> Jan 28, 2018 11:21 AM Baldo DaubAlexander, Amber L wrote: Has patient seen PCP for this complaint? Yes - states pt told doctor he had plantar factitias *If NO, is insurance requiring patient see PCP for this issue before PCP can refer them? Referral for which specialty: Podiatrist Preferred provider/office: unknown, just as long as it is within his plan Reason for referral: plantar factitias  Pt can be reached at [phone]

## 2018-01-28 NOTE — Telephone Encounter (Signed)
Ok to refer.

## 2018-01-30 DIAGNOSIS — R0602 Shortness of breath: Secondary | ICD-10-CM | POA: Diagnosis not present

## 2018-01-30 DIAGNOSIS — Z720 Tobacco use: Secondary | ICD-10-CM | POA: Diagnosis not present

## 2018-01-30 DIAGNOSIS — J449 Chronic obstructive pulmonary disease, unspecified: Secondary | ICD-10-CM | POA: Diagnosis not present

## 2018-01-30 DIAGNOSIS — R42 Dizziness and giddiness: Secondary | ICD-10-CM | POA: Diagnosis not present

## 2018-01-30 DIAGNOSIS — I491 Atrial premature depolarization: Secondary | ICD-10-CM | POA: Diagnosis not present

## 2018-01-30 DIAGNOSIS — R69 Illness, unspecified: Secondary | ICD-10-CM | POA: Diagnosis not present

## 2018-01-30 DIAGNOSIS — I679 Cerebrovascular disease, unspecified: Secondary | ICD-10-CM | POA: Diagnosis not present

## 2018-01-30 DIAGNOSIS — E039 Hypothyroidism, unspecified: Secondary | ICD-10-CM | POA: Diagnosis not present

## 2018-01-30 DIAGNOSIS — I4891 Unspecified atrial fibrillation: Secondary | ICD-10-CM | POA: Diagnosis not present

## 2018-01-30 DIAGNOSIS — R000 Tachycardia, unspecified: Secondary | ICD-10-CM | POA: Diagnosis not present

## 2018-01-30 DIAGNOSIS — I480 Paroxysmal atrial fibrillation: Secondary | ICD-10-CM | POA: Diagnosis not present

## 2018-01-30 DIAGNOSIS — K219 Gastro-esophageal reflux disease without esophagitis: Secondary | ICD-10-CM | POA: Diagnosis not present

## 2018-01-30 DIAGNOSIS — R079 Chest pain, unspecified: Secondary | ICD-10-CM | POA: Diagnosis not present

## 2018-01-30 DIAGNOSIS — R072 Precordial pain: Secondary | ICD-10-CM | POA: Diagnosis not present

## 2018-01-30 DIAGNOSIS — N400 Enlarged prostate without lower urinary tract symptoms: Secondary | ICD-10-CM | POA: Diagnosis not present

## 2018-01-30 DIAGNOSIS — R0689 Other abnormalities of breathing: Secondary | ICD-10-CM | POA: Diagnosis not present

## 2018-01-30 DIAGNOSIS — R7989 Other specified abnormal findings of blood chemistry: Secondary | ICD-10-CM | POA: Diagnosis not present

## 2018-01-30 DIAGNOSIS — M109 Gout, unspecified: Secondary | ICD-10-CM | POA: Diagnosis not present

## 2018-01-30 DIAGNOSIS — R0789 Other chest pain: Secondary | ICD-10-CM | POA: Diagnosis not present

## 2018-01-30 DIAGNOSIS — I493 Ventricular premature depolarization: Secondary | ICD-10-CM | POA: Diagnosis not present

## 2018-01-31 DIAGNOSIS — R0789 Other chest pain: Secondary | ICD-10-CM | POA: Diagnosis not present

## 2018-01-31 DIAGNOSIS — E039 Hypothyroidism, unspecified: Secondary | ICD-10-CM | POA: Diagnosis not present

## 2018-01-31 DIAGNOSIS — R079 Chest pain, unspecified: Secondary | ICD-10-CM | POA: Diagnosis not present

## 2018-01-31 DIAGNOSIS — M109 Gout, unspecified: Secondary | ICD-10-CM | POA: Diagnosis not present

## 2018-01-31 DIAGNOSIS — N400 Enlarged prostate without lower urinary tract symptoms: Secondary | ICD-10-CM | POA: Diagnosis not present

## 2018-01-31 DIAGNOSIS — I313 Pericardial effusion (noninflammatory): Secondary | ICD-10-CM | POA: Diagnosis not present

## 2018-01-31 DIAGNOSIS — I340 Nonrheumatic mitral (valve) insufficiency: Secondary | ICD-10-CM | POA: Diagnosis not present

## 2018-01-31 DIAGNOSIS — I350 Nonrheumatic aortic (valve) stenosis: Secondary | ICD-10-CM | POA: Diagnosis not present

## 2018-01-31 DIAGNOSIS — R69 Illness, unspecified: Secondary | ICD-10-CM | POA: Diagnosis not present

## 2018-01-31 DIAGNOSIS — I480 Paroxysmal atrial fibrillation: Secondary | ICD-10-CM | POA: Diagnosis not present

## 2018-01-31 DIAGNOSIS — R7989 Other specified abnormal findings of blood chemistry: Secondary | ICD-10-CM | POA: Diagnosis not present

## 2018-01-31 MED ORDER — ALLOPURINOL 100 MG PO TABS
300.00 | ORAL_TABLET | ORAL | Status: DC
Start: 2018-02-01 — End: 2018-01-31

## 2018-01-31 MED ORDER — LEVOTHYROXINE SODIUM 112 MCG PO TABS
112.00 | ORAL_TABLET | ORAL | Status: DC
Start: 2018-02-01 — End: 2018-01-31

## 2018-01-31 MED ORDER — TAMSULOSIN HCL 0.4 MG PO CAPS
0.40 | ORAL_CAPSULE | ORAL | Status: DC
Start: 2018-02-01 — End: 2018-01-31

## 2018-01-31 MED ORDER — GABAPENTIN 300 MG PO CAPS
300.00 | ORAL_CAPSULE | ORAL | Status: DC
Start: ? — End: 2018-01-31

## 2018-01-31 MED ORDER — ATORVASTATIN CALCIUM 10 MG PO TABS
20.00 | ORAL_TABLET | ORAL | Status: DC
Start: 2018-02-01 — End: 2018-01-31

## 2018-01-31 MED ORDER — NITROGLYCERIN 0.4 MG SL SUBL
0.40 | SUBLINGUAL_TABLET | SUBLINGUAL | Status: DC
Start: ? — End: 2018-01-31

## 2018-01-31 MED ORDER — PANTOPRAZOLE SODIUM 40 MG PO TBEC
40.00 | DELAYED_RELEASE_TABLET | ORAL | Status: DC
Start: 2018-01-31 — End: 2018-01-31

## 2018-01-31 MED ORDER — CLONAZEPAM 1 MG PO TABS
1.00 | ORAL_TABLET | ORAL | Status: DC
Start: ? — End: 2018-01-31

## 2018-01-31 MED ORDER — CLOPIDOGREL BISULFATE 75 MG PO TABS
75.00 | ORAL_TABLET | ORAL | Status: DC
Start: 2018-02-01 — End: 2018-01-31

## 2018-01-31 MED ORDER — CARVEDILOL 3.125 MG PO TABS
3.13 | ORAL_TABLET | ORAL | Status: DC
Start: 2018-02-01 — End: 2018-01-31

## 2018-02-02 ENCOUNTER — Telehealth: Payer: Self-pay | Admitting: Internal Medicine

## 2018-02-02 ENCOUNTER — Encounter: Payer: Self-pay | Admitting: Internal Medicine

## 2018-02-02 ENCOUNTER — Ambulatory Visit (INDEPENDENT_AMBULATORY_CARE_PROVIDER_SITE_OTHER): Payer: Medicare HMO | Admitting: Internal Medicine

## 2018-02-02 VITALS — BP 136/64 | HR 80 | Temp 97.4°F | Resp 18 | Ht 72.0 in | Wt 258.0 lb

## 2018-02-02 DIAGNOSIS — M722 Plantar fascial fibromatosis: Secondary | ICD-10-CM | POA: Diagnosis not present

## 2018-02-02 DIAGNOSIS — E039 Hypothyroidism, unspecified: Secondary | ICD-10-CM | POA: Diagnosis not present

## 2018-02-02 DIAGNOSIS — I480 Paroxysmal atrial fibrillation: Secondary | ICD-10-CM

## 2018-02-02 DIAGNOSIS — Z09 Encounter for follow-up examination after completed treatment for conditions other than malignant neoplasm: Secondary | ICD-10-CM

## 2018-02-02 MED ORDER — CARVEDILOL 3.125 MG PO TABS: 3.1250 mg | ORAL_TABLET | Freq: Two times a day (BID) | ORAL | 5 refills | Status: DC

## 2018-02-02 MED ORDER — APIXABAN 5 MG PO TABS: 5.0000 mg | ORAL_TABLET | Freq: Two times a day (BID) | ORAL | 5 refills | Status: DC

## 2018-02-02 NOTE — Telephone Encounter (Signed)
[facility]Paz. Dr. Drue NovelPaz and Dr.Acharya discussed the patients the condition.

## 2018-02-02 NOTE — Progress Notes (Signed)
Pre visit review using our clinic review tool, if applicable. No additional management support is needed unless otherwise documented below in the visit note. 

## 2018-02-02 NOTE — Telephone Encounter (Signed)
[facility] for eval of afib and PVCs. Strips available for review in Dr. Leta JunglingPaz's note.

## 2018-02-02 NOTE — Progress Notes (Signed)
Subjective:    Patient ID: Samuel Frey, male    DOB: [birth year], 77 y.o.   MRN: [id number]  DOS:  02/02/2018 Type of visit - description : hosp f/u  Hospital follow-up, no TCM [facility], discharged the next day. DC summary reviewed DX new onset atrial fibrillation, chest pain. HR upon admission ~ 130-170s. Prior to discharge heart rate was 80-90 and A. fib resolved. (unable to see actual EKGs from Care Everywhere) He was discharged on Eliquis and carvedilol. Not taking them He also was rx  a ZIO Patch monitor. Chest pain: Was admitted to telemetry, s/p heparin drip.  Troponin peaked at 1.3 and then trended down per d/c summary. TTE show a EF of 55%. TSH normal. Cardiology recommended no further cardiac evaluation for CAD.   Review of Systems Since he was discharged from the hospital, he decided not to take Eliquis and carvedilol mostly due to cost issues, he thinks they will be very expensive. He continue with Plavix. At this point he denies chest pain, difficulty breathing, edema or palpitations. Additionally, he has plantar fasciitis and has not been able to be treated.  EMS strips PTA to the ER:           Past Medical History:  Diagnosis Date  . Arthritis   . Blood transfusion without reported diagnosis   . Emphysema of lung (HCC)   . GERD (gastroesophageal reflux disease)   . Gout   . Herpes simplex   . Hx of colonic polyps   . Hx of diverticulitis of colon   . Hyperglycemia   . Hypogonadism in male   . Hypothyroidism   . Lower urinary tract symptoms (LUTS)   . OSA (obstructive sleep apnea)   . Solitary lung nodule   . TIA (transient ischemic attack)     Past Surgical History:  Procedure Laterality Date  . HERNIA REPAIR Left 2018    Social History   Socioeconomic History  . Marital status: Single    Spouse name: Not on file  . Number of children: 0  . Years of education: Not on file  . Highest  education level: Not on file  Occupational History  . 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 Financial resource strain: Not on file  . Food insecurity:    Worry: Not on file    Inability: Not on file  . Transportation needs:    Medical: Not on file    Non-medical: Not on file  Tobacco Use  . Smoking status: Former Smoker    Last attempt to quit: 10/19/2012    Years since quitting: 5.2  . Smokeless tobacco: Never Used  . Tobacco comment: 1-2 ppd  Substance and Sexual Activity  . Alcohol use: Yes    Comment: Social- 2 drinks per day normally  . Drug use: Never  . Sexual activity: Not Currently  Lifestyle  . Physical activity:    Days per week: Not on file    Minutes per session: Not on file  . Stress: Not on file  Relationships  . Social connections:    Talks on phone: Not on file    Gets together: Not on file    Attends religious service: Not on file    Active member of club or organization: Not on file    Attends meetings of clubs or organizations: Not on file    Relationship status: Not on file  . Intimate partner violence:  Fear of current or ex partner: Not on file    Emotionally abused: Not on file    Physically abused: Not on file    Forced sexual activity: Not on file  Other Topics Concern  . Not on file  Social History Narrative   Moved from [place] to [place], 09/2017   Sister is Christain [place]       Allergies as of 02/02/2018   No Known Allergies     Medication List        Accurate as of 02/02/18 11:59 PM. Always use your most recent med list.          alfuzosin 10 MG 24 hr tablet Commonly known as:  UROXATRAL Take 1 tablet (10 mg total) by mouth daily with breakfast.   allopurinol 300 MG tablet Commonly known as:  ZYLOPRIM Take 1 tablet (300 mg total) by mouth daily.   apixaban 5 MG Tabs tablet Commonly known as:  ELIQUIS Take 1 tablet (5 mg total) by mouth 2 (two) times daily.   carvedilol 3.125 MG tablet Commonly known as:   COREG Take 1 tablet (3.125 mg total) by mouth 2 (two) times daily with a meal.   clonazePAM 1 MG tablet Commonly known as:  KLONOPIN Take 1 tablet (1 mg total) by mouth daily.   famotidine 20 MG tablet Commonly known as:  PEPCID Take 1 tablet (20 mg total) by mouth at bedtime.   gabapentin 300 MG capsule Commonly known as:  NEURONTIN Take 300 mg by mouth as needed.   levothyroxine 112 MCG tablet Commonly known as:  SYNTHROID, LEVOTHROID Take 1 tablet (112 mcg total) by mouth daily before breakfast.   pantoprazole 20 MG tablet Commonly known as:  PROTONIX Take 1 tablet (20 mg total) by mouth 2 (two) times daily before a meal.   simvastatin 40 MG tablet Commonly known as:  ZOCOR Take 1 tablet (40 mg total) by mouth daily.           Objective:   Physical Exam BP 136/64 (BP Location: Left Arm, Patient Position: Sitting, Cuff Size: Large)   Pulse 80   Temp (!) 97.4 F (36.3 C) (Oral)   Resp 18   Ht 6' (1.829 m)   Wt 258 lb (117 kg)   SpO2 94%   BMI 34.99 kg/m   General:   Well developed, NAD, BMI noted. HEENT:  Normocephalic . Face symmetric, atraumatic Lungs:  CTA B Normal respiratory effort, no intercostal retractions, no accessory muscle use. Heart: freq irreg heart beats.  No pretibial edema bilaterally  Skin: Not pale. Not jaundice Neurologic:  alert & oriented X3.  Speech normal, gait appropriate for age and unassisted Psych--  Cognition and judgment appear intact.  Cooperative with normal attention span and concentration.  Behavior appropriate. No anxious or depressed appearing.      Assessment & Plan:    Assessment  New pt 10/19/17, referred by B. Rossetti Hyperglycemia: A1C 6.3 (10/2017) Paroxysmal atrial fibrillation, DX 11-20 19 TIA ~ 2017 CP: low risk stress Frey ~ 05/2017 Gout Hypothyroidism  Hyperlipidemia RLS - on clonazepam prn GERD LUTS: after several intolerances to meds now on Alfuzosin Hypogonadism  PULM: --COPD --Lung nodules   MSK --DJD -- Cervical Spinal stenosis Nasal nodule/polyps (saw ENT, had a endoscopy~ 2-05/2017) L hernia repair ~ 2018, still hurts   Plan: P. Atrial fibrillation:  New onset, admitted to [facility], TSH was normal, echo showed a EF of 55%. According to the notes, he converted to NSR  before discharge. Interestingly, the patient brought the EMS cardiac strips (see pictures), apparently the doctors at [facility] did not have a chance to look at them;  i reviewed them with the cardiology DOD and although they are hard to interpreting, he was most likely on  A. fib. Today pulse seems irregular however EKG shows sinus rhythm with ectopic ventricular beats.   Pt decided not to start Eliquis and carvedilol, afraid about the cost.   Advise pt ref the importance of taking those medications, risk of stroke. Risk of bleeding w/  eliquis and cost of carvedilol (generic) discussed  Eliquis samples provided today.  5 mg twice daily. New Rx sen. DC plavix Refer to a local cardiology ASAP locally, has a ZIO patch monitor ER if chest pain,  Palpitations Planta fasciitis: Since the last visit he called for a referral, unable to see a podiatrist, refer to Ortho, needs to be on his neck were Hypothyroidism: TSH on 01/30/18:  16.  No change RTC 4 weeks  Today, I spent more than 45   min with the patient: >50% of the time counseling regards risk /benefits of meds, cost of meds, implications of AFib, reviewing records from recent admission and discuss an EKG with cardiology DOD.v

## 2018-02-02 NOTE — Patient Instructions (Addendum)
  GO TO THE FRONT DESK Schedule your next appointment for a checkup in 1 month  Stop Plavix  Start Eliquis and carvedilol  Go to the ER chest pain, palpitations, swelling of your legs, shortness of breath

## 2018-02-03 NOTE — Assessment & Plan Note (Signed)
  P. Atrial fibrillation:  New onset, admitted to [facility], TSH was normal, echo showed a EF of 55%. According to the notes, he converted to NSR before discharge. Interestingly, the patient brought the EMS cardiac strips (see pictures), [facility] not have a [facility] to look at them;  i reviewed them with the cardiology DOD and although they are hard to interpreting, he was most likely on  A. fib. Today pulse seems irregular however EKG shows sinus rhythm with ectopic ventricular beats.   Pt decided not to start Eliquis and carvedilol, afraid about the cost.   Advise pt ref the importance of taking those medications, risk of stroke. Risk of bleeding w/  eliquis and cost of carvedilol (generic) discussed  Eliquis samples provided today.  5 mg twice daily. New Rx sen. DC plavix Refer to a local cardiology ASAP locally, has a ZIO patch monitor ER if chest pain,  Palpitations Planta fasciitis: Since the last visit he called for a referral, unable to see a podiatrist, refer to Ortho, needs to be on his neck were Hypothyroidism: TSH on 01/30/18:  16.  No change RTC 4 weeks

## 2018-02-04 ENCOUNTER — Encounter: Payer: Self-pay | Admitting: Internal Medicine

## 2018-02-09 ENCOUNTER — Encounter (INDEPENDENT_AMBULATORY_CARE_PROVIDER_SITE_OTHER): Payer: Self-pay | Admitting: Family Medicine

## 2018-02-09 ENCOUNTER — Ambulatory Visit (INDEPENDENT_AMBULATORY_CARE_PROVIDER_SITE_OTHER): Payer: Medicare HMO | Admitting: Family Medicine

## 2018-02-09 DIAGNOSIS — G8929 Other chronic pain: Secondary | ICD-10-CM | POA: Diagnosis not present

## 2018-02-09 DIAGNOSIS — M79671 Pain in right foot: Secondary | ICD-10-CM | POA: Diagnosis not present
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
## 2018-02-16 ENCOUNTER — Ambulatory Visit (INDEPENDENT_AMBULATORY_CARE_PROVIDER_SITE_OTHER): Payer: Medicare HMO | Admitting: Cardiology

## 2018-02-16 ENCOUNTER — Encounter: Payer: Self-pay | Admitting: Cardiology

## 2018-02-16 VITALS — BP 138/76 | HR 81 | Ht 72.0 in | Wt 253.0 lb

## 2018-02-16 DIAGNOSIS — I480 Paroxysmal atrial fibrillation: Secondary | ICD-10-CM

## 2018-02-16 DIAGNOSIS — G459 Transient cerebral ischemic attack, unspecified: Secondary | ICD-10-CM

## 2018-02-16 NOTE — Patient Instructions (Signed)
Medication Instructions:  Your physician recommends that you continue on your current medications as directed. Please refer to the Current Medication list given to you today.  If you need a refill on your cardiac medications before your next appointment, please call your pharmacy.   Lab work: None  If you have labs (blood work) drawn today and your tests are completely normal, you will receive your results only by: Marland Kitchen. MyChart Message (if you have MyChart) OR . A paper copy in the mail If you have any lab test that is abnormal or we need to change your treatment, we will call you to review the results.  Testing/Procedures: None  Follow-Up: At [facility], you and your health needs are our priority.  As part of our continuing mission to provide you with exceptional heart care, we have created designated Provider Care Teams.  These Care Teams include your primary Cardiologist (physician) and Advanced Practice Providers (APPs -  Physician Assistants and Nurse Practitioners) who all work together to provide you with the care you need, when you need it.  You will need a follow up appointment in 3 months.  Please call our office 2 months in advance to schedule this appointment.  You may see another member of our [facility] Provider Team in [place]: Gypsy Balsamobert Krasowski, MD . Norman HerrlichBrian Munley, MD  Any Other Special Instructions Will Be Listed Below (If Applicable).
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
## 2018-02-17 ENCOUNTER — Ambulatory Visit: Payer: Medicare HMO | Admitting: Internal Medicine

## 2018-02-18 DIAGNOSIS — M722 Plantar fascial fibromatosis: Secondary | ICD-10-CM | POA: Diagnosis not present

## 2018-02-21 ENCOUNTER — Encounter: Payer: Self-pay | Admitting: Internal Medicine

## 2018-02-22 DIAGNOSIS — I4891 Unspecified atrial fibrillation: Secondary | ICD-10-CM | POA: Diagnosis not present

## 2018-02-23 DIAGNOSIS — M722 Plantar fascial fibromatosis: Secondary | ICD-10-CM | POA: Diagnosis not present

## 2018-02-28 DIAGNOSIS — I471 Supraventricular tachycardia: Secondary | ICD-10-CM | POA: Diagnosis not present

## 2018-02-28 DIAGNOSIS — I472 Ventricular tachycardia: Secondary | ICD-10-CM | POA: Diagnosis not present

## 2018-02-28 DIAGNOSIS — I4891 Unspecified atrial fibrillation: Secondary | ICD-10-CM | POA: Diagnosis not present

## 2018-03-02 ENCOUNTER — Encounter: Payer: Self-pay | Admitting: Internal Medicine

## 2018-03-02 ENCOUNTER — Ambulatory Visit (INDEPENDENT_AMBULATORY_CARE_PROVIDER_SITE_OTHER): Payer: Medicare HMO | Admitting: Internal Medicine

## 2018-03-02 VITALS — BP 138/84 | HR 74 | Temp 98.4°F | Resp 16 | Ht 72.0 in | Wt 266.2 lb

## 2018-03-02 DIAGNOSIS — E039 Hypothyroidism, unspecified: Secondary | ICD-10-CM | POA: Diagnosis not present

## 2018-03-02 DIAGNOSIS — I480 Paroxysmal atrial fibrillation: Secondary | ICD-10-CM | POA: Diagnosis not present

## 2018-03-02 LAB — TSH: TSH: 1.81 u[IU]/mL (ref 0.35–4.50)

## 2018-03-02 MED ORDER — CARVEDILOL 3.125 MG PO TABS: ORAL_TABLET | ORAL | 5 refills | Status: DC

## 2018-03-02 NOTE — Patient Instructions (Addendum)
Please schedule Medicare Wellness with Lawanna KobusAngel.   GO TO THE LAB : Get the blood work     GO TO THE FRONT DESK Schedule your next appointment for a  Check up in 3 months.  Increase carvedilol to 1.5 tablets twice a day  If you have a prolonged (more than 15 minutes), severe episode of atrial fibrillation or if it is associated with prolonged and severe chest pain: Call 911.   Check the  blood pressure  weekly  Be sure your blood pressure is between 110/65 and  135/85. If it is consistently higher or lower, let me know

## 2018-03-02 NOTE — Progress Notes (Signed)
Subjective:    Patient ID: Samuel Frey, male    DOB: May 25, 1940, 77 y.o.   MRN: [id number]  DOS:  03/02/2018 Type of visit - description : Follow-up Atrial fibrillation: Note from cardiology reviewed. Good compliance with medication Yesterday, had a episode of right-sided chest pressure along with right shoulder pain.  At the same time he felt his heart "pounding" at the anterior neck. He checked his BP and it was elevated, episode lasted 20 to 25 minutes and it subsided.   Review of Systems Still having issues with plantar fasciitis. Still concerned about the pulmonary nodule.   Past Medical History:  Diagnosis Date  . Arthritis   . Blood transfusion without reported diagnosis   . Emphysema of lung (HCC)   . GERD (gastroesophageal reflux disease)   . Gout   . Herpes simplex   . Hx of colonic polyps   . Hx of diverticulitis of colon   . Hyperglycemia   . Hypogonadism in male   . Hypothyroidism   . Lower urinary tract symptoms (LUTS)   . OSA (obstructive sleep apnea)   . Solitary lung nodule   . TIA (transient ischemic attack)     Past Surgical History:  Procedure Laterality Date  . HERNIA REPAIR Left 2018    Social History   Socioeconomic History  . Marital status: Single    Spouse name: Not on file  . Number of children: 0  . Years of education: Not on file  . Highest education level: Not on file  Occupational History  . Occupation: retired, Museum/gallery conservator  . Financial resource strain: Not on file  . Food insecurity:    Worry: Not on file    Inability: Not on file  . Transportation needs:    Medical: Not on file    Non-medical: Not on file  Tobacco Use  . Smoking status: Former Smoker    Last attempt to quit: 10/19/2012    Years since quitting: 5.3  . Smokeless tobacco: Never Used  . Tobacco comment: 1-2 ppd  Substance and Sexual Activity  . Alcohol use: Yes    Comment: Social- 2 drinks per day normally  . Drug use: Never  .  Sexual activity: Not Currently  Lifestyle  . Physical activity:    Days per week: Not on file    Minutes per session: Not on file  . Stress: Not on file  Relationships  . Social connections:    Talks on phone: Not on file    Gets together: Not on file    Attends religious service: Not on file    Active member of club or organization: Not on file    Attends meetings of clubs or organizations: Not on file    Relationship status: Not on file  . Intimate partner violence:    Fear of current or ex partner: Not on file    Emotionally abused: Not on file    Physically abused: Not on file    Forced sexual activity: Not on file  Other Topics Concern  . Not on file  Social History Narrative   Moved from [place] to [place], 09/2017   Sister is Christain [place]       Allergies as of 03/02/2018   No Known Allergies     Medication List       Accurate as of March 02, 2018  5:54 PM. Always use your most recent med list.  alfuzosin 10 MG 24 hr tablet Commonly known as:  UROXATRAL Take 1 tablet (10 mg total) by mouth daily with breakfast.   allopurinol 300 MG tablet Commonly known as:  ZYLOPRIM Take 1 tablet (300 mg total) by mouth daily.   apixaban 5 MG Tabs tablet Commonly known as:  ELIQUIS Take 1 tablet (5 mg total) by mouth 2 (two) times daily.   carvedilol 3.125 MG tablet Commonly known as:  COREG Take 1.5 tablets by mouth twice daily   clonazePAM 1 MG tablet Commonly known as:  KLONOPIN Take 1 tablet (1 mg total) by mouth daily.   famotidine 20 MG tablet Commonly known as:  PEPCID Take 1 tablet (20 mg total) by mouth at bedtime.   gabapentin 300 MG capsule Commonly known as:  NEURONTIN Take 300 mg by mouth as needed.   levothyroxine 112 MCG tablet Commonly known as:  SYNTHROID, LEVOTHROID Take 1 tablet (112 mcg total) by mouth daily before breakfast.   pantoprazole 20 MG tablet Commonly known as:  PROTONIX Take 1 tablet (20 mg total) by mouth 2 (two)  times daily before a meal.   simvastatin 40 MG tablet Commonly known as:  ZOCOR Take 1 tablet (40 mg total) by mouth daily.           Objective:   Physical Exam BP 138/84 (BP Location: Right Arm, Patient Position: Sitting, Cuff Size: Normal)   Pulse 74   Temp 98.4 F (36.9 C) (Oral)   Resp 16   Ht 6' (1.829 m)   Wt 266 lb 4 oz (120.8 kg)   SpO2 98%   BMI 36.11 kg/m   General:   Well developed, NAD, BMI noted. HEENT:  Normocephalic . Face symmetric, atraumatic Lungs:  CTA B Normal respiratory effort, no intercostal retractions, no accessory muscle use. Heart: Seems irregular today.     No pretibial edema bilaterally  Skin: Not pale. Not jaundice Neurologic:  alert & oriented X3.  Speech normal, gait appropriate for age and unassisted Psych--  Cognition and judgment appear intact.  Cooperative with normal attention span and concentration.  Behavior appropriate. Apprehensive but no depressed appearing.      Assessment & Plan:      Assessment  New pt 10/19/17, referred by B. Rossetti Hyperglycemia: A1C 6.3 (10/2017) CV: --TIA ~ 2017 --CP: low risk stress test ~ 05/2017 --Paroxysmal atrial fibrillation, DX 01/2018 Gout Hypothyroidism  Hyperlipidemia RLS - on clonazepam prn GERD LUTS: after several intolerances to meds now on Alfuzosin Hypogonadism  PULM: --COPD --Lung nodules:  March 02, 2016: Stable 7 mm pulmonary nodule unchanged since CT on May 2016. CT chest 10/05/2016: Compared to previous exam on August 20, 2015, right pulmonary nodule is unchanged.  MSK --DJD -- Cervical Spinal stenosis Nasal nodule/polyps (saw ENT, had a endoscopy~ 2-05/2017) L hernia repair ~ 2018, still hurts   Plan: Atrial fibrillation, TIA: Patient was seen by cardiology 02/16/2018. Multiple questions were answered regards rate and rhythm control as well as anticoagulation. He was recommended to stay on apixaban 5 mg twice a day and carvedilol. RTC was rec at 6 m. Today the  patient however reports that he really does not know exactly what atrial fibrillation is and what to expect.  I carefully discussed what is atrial fibrillation, what to expect, explained that sometimes we do not have a obvious etiology.   He is currently taking the most appropriate treatment which is anticoagulants and BBs. His baseline heart rate is 74, he had an episode of  apparent atrial fibrillation yesterday.  Will increase carvedilol 3.125 mg to 1.5 tablets B.I.D..  Watch BPs.  Red flags that indicate the need to call 911 discussed. Solitary lung nodule: Has been stable since at least June 2017 on 3 CT scans.  He remains somewhat apprehensive, offered CT, however again we agreed to wait until next year. Anxiety: I believe he is very anxious regarding his medical issues, he denies having any problems with his mood. Hypothyroidism: Check a TSH Plantar fasciitis: Still an issue, seen elsewhere. RTC 3 months  Today, I spent more than  25  min with the patient: >50% of the time counseling regards atrial fibrillation, what it is, what to expect, reviewed the chart, answering a number of questions.

## 2018-03-02 NOTE — Assessment & Plan Note (Signed)
Atrial fibrillation, TIA: Patient was seen by cardiology 02/16/2018. Multiple questions were answered regards rate and rhythm control as well as anticoagulation. He was recommended to stay on apixaban 5 mg twice a day and carvedilol. RTC was rec at 6 m. Today the patient however reports that he really does not know exactly what atrial fibrillation is and what to expect.  I carefully discussed what is atrial fibrillation, what to expect, explained that sometimes we do not have a obvious etiology.   He is currently taking the most appropriate treatment which is anticoagulants and BBs. His baseline heart rate is 74, he had an episode of apparent atrial fibrillation yesterday.  Will increase carvedilol 3.125 mg to 1.5 tablets B.I.D..  Watch BPs.  Red flags that indicate the need to call 911 discussed. Solitary lung nodule: Has been stable since at least June 2017 on 3 CT scans.  He remains somewhat apprehensive, offered CT, however again we agreed to wait until next year. Anxiety: I believe he is very anxious regarding his medical issues, he denies having any problems with his mood. Hypothyroidism: Check a TSH Plantar fasciitis: Still an issue, seen elsewhere. RTC 3 months

## 2018-03-02 NOTE — Progress Notes (Signed)
Pre visit review using our clinic review tool, if applicable. No additional management support is needed unless otherwise documented below in the visit note. 

## 2018-03-04 DIAGNOSIS — M722 Plantar fascial fibromatosis: Secondary | ICD-10-CM | POA: Diagnosis not present

## 2018-03-07 ENCOUNTER — Ambulatory Visit (INDEPENDENT_AMBULATORY_CARE_PROVIDER_SITE_OTHER): Payer: Medicare HMO | Admitting: Family Medicine

## 2018-03-07 ENCOUNTER — Encounter (INDEPENDENT_AMBULATORY_CARE_PROVIDER_SITE_OTHER): Payer: Self-pay | Admitting: Family Medicine

## 2018-03-07 ENCOUNTER — Ambulatory Visit (INDEPENDENT_AMBULATORY_CARE_PROVIDER_SITE_OTHER): Payer: Medicare HMO

## 2018-03-07 DIAGNOSIS — G8929 Other chronic pain: Secondary | ICD-10-CM | POA: Diagnosis not present

## 2018-03-07 DIAGNOSIS — M79671 Pain in right foot: Secondary | ICD-10-CM

## 2018-03-07 NOTE — Progress Notes (Signed)
Office Visit Note   Patient: Samuel Frey           Date of Birth: [birth year]           MRN: [id number] Visit Date: 03/07/2018 Requested by: Wanda PlumpPaz, Jose E, MD [address] [place], [place] [id number] PCP: Wanda PlumpPaz, Jose E, MD  Subjective: Chief Complaint  Patient presents with  . Right Foot - Pain    HPI: He is here with worsening right heel pain.  Physical therapy unfortunately has not been helping.  He has tried multiple styles of shoes, is doing stretches on a regular basis.  Pain is now constant, even at rest.              ROS: Noncontributory  Objective: Vital Signs: There were no vitals taken for this visit.  Physical Exam:  Right heel: No tenderness at the Achilles or with medial/lateral calcaneal squeeze, but he is exquisitely tender at the plantar fascial origin.  No pain with flexion of the toes against resistance.  Imaging: X-rays right heel:  Traction spurs at Achilles insertion and plantar fascia insertion on calcaneus.  No sign of stress fracture or neoplasm.  Vascular calcification visible.  Midfoot degenerative change.    Assessment & Plan: 1.  Chronic right heel pain with steadily worsening pain, exam suggests plantar fasciitis but cannot rule out stress fracture or referred pain from elsewhere such as lumbar radiculopathy. -MRI to further evaluate.  Cortisone injection if plantar fasciitis confirmed and other problems ruled out.   Follow-Up Instructions: No follow-ups on file.      Procedures: No procedures performed  No notes on file    PMFS History: Patient Active Problem List   Diagnosis Date Noted  . Paroxysmal atrial fibrillation (HCC) 01/30/2018  . PCP notes >>>>>>>> 11/17/2017  . RLS (restless legs syndrome) 11/17/2017  . Hyperglycemia   . Emphysema of lung (HCC)   . TIA (transient ischemic attack)   . Hypothyroidism   . Lower urinary tract symptoms (LUTS)   . DJD , spinal stenosis   . Lung nodules 10/19/2017    Past Medical History:  Diagnosis Date  . Arthritis   . Blood transfusion without reported diagnosis   . Emphysema of lung (HCC)   . GERD (gastroesophageal reflux disease)   . Gout   . Herpes simplex   . Hx of colonic polyps   . Hx of diverticulitis of colon   . Hyperglycemia   . Hypogonadism in male   . Hypothyroidism   . Lower urinary tract symptoms (LUTS)   . OSA (obstructive sleep apnea)   . Solitary lung nodule   . TIA (transient ischemic attack)     Family History  Problem Relation Age of Onset  . COPD Father   . CAD Maternal Grandfather   . Scleroderma Mother   . Colon cancer Neg Hx   . Prostate cancer Neg Hx     Past Surgical History:  Procedure Laterality Date  . HERNIA REPAIR Left 2018   Social History   Occupational History  . 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 Smoking status: Former Smoker    Last attempt to quit: 10/19/2012    Years since quitting: 5.3  . Smokeless tobacco: Never Used  . Tobacco comment: 1-2 ppd  Substance and Sexual Activity  . Alcohol use: Yes    Comment: Social- 2 drinks per day normally  . Drug use: Never  . Sexual activity: Not Currently

## 2018-03-07 NOTE — Progress Notes (Signed)
Cardiology Office Note:    Date:  03/10/2018   ID:  Samuel Frey, DOB 1940-10-11, MRN [id number]  PCP:  Wanda Plump, MD  Cardiologist:  Norman Herrlich, MD    Referring MD: Wanda Plump, MD    ASSESSMENT:    1. Paroxysmal atrial fibrillation (HCC)   2. Chest pain in adult   3. Chronic anticoagulation   4. Hypothyroidism, unspecified type    PLAN:    In order of problems listed above:  1. Recurrent symptomatic after discussion will initiate low-dose amiodarone continue beta-blocker anticoagulant. 2. He is having symptoms of typical angina had a recent episode of troponin elevation with chest pain likely type II myocardial infarction and after discussion will undergo cardiac CTA for further evaluation. 3. Continue his anticoagulant 4. Continue thyroid supplement he will need frequent labs either through his PCP or through me in 1 month to check thyroid and liver function   Next appointment: 6 months   Medication Adjustments/Labs and Tests Ordered: Current medicines are reviewed at length with the patient today.  Concerns regarding medicines are outlined above.  No orders of the defined types were placed in this encounter.  No orders of the defined types were placed in this encounter.   Chief Complaint  Patient presents with  . Follow-up    Zio patch   . Atrial Fibrillation    History of Present Illness:    Samuel Frey is a 77 y.o. male with a hx of paroxysmal atrial fibrillation TIA and hypertension last seen by Dr. Tomie China 02/16/2018.  An echocardiogram 03/17/2017 with severe LVH normal ejection fraction mild mitral regurgitation performed in [place].  He is hypothyroid with a normal TSH.  He is admitted to [facility] in November with rapid atrial fibrillation that converted to sinus rhythm within 24 hours and chest pain with no evidence of acute cardiac injury..  Because of a history of previous TIA Plavix was withdrawn and he is  transition to a direct anticoagulant Eliquis.  Records in care everywhere show 2 EKGs both described as sinus rhythm with no previous EKG for comparison.  It appears a ZIO patch monitor was performed and there is no report in the system Compliance with diet, lifestyle and medications: Yes  He is seen by me in the office today for several reasons the first is he thought that I had the results of a ZIO monitor he wore through [facility] it is unavailable.  The second is he is had recurrent episodes of rapid heart rhythm with typical anginal discomfort.  I reviewed the records from [facility] he had a significant rise in his troponin that was attributed to rapid atrial fibrillation but he also had typical angina I suspect he had a type II myocardial infarction.  Is a very difficult case and I told him the first thing I would like to do is to find out what he has severe obstructive CAD reviewed the options of invasive coronary angiography or noninvasive cardiac CTA and he will undergo CTA as an outpatient.  I think we need to put him on antiarrhythmic therapy and with the question of CAD the only drug he feels safe using his amiodarone will start with relatively low dose 40 mg a day 2 weeks then 200 mg daily and reviewed with him the potential for thyroid liver and lung toxicity and will need to follow him as an outpatient.  His episodes of atrial fibrillation or rapid heart  rhythm and beating in his throat.  His chest pain is typical angina substernal pressure radiating up into the shoulder into the neck when he has rapid atrial fibrillation he has taken nitroglycerin with relief.  He said in the last year had a myocardial perfusion study performed in [place] as interpreted as normal. Past Medical History:  Diagnosis Date  . Arthritis   . Blood transfusion without reported diagnosis   . Emphysema of lung (HCC)   . GERD (gastroesophageal reflux disease)   . Gout   . Herpes simplex   . Hx  of colonic polyps   . Hx of diverticulitis of colon   . Hyperglycemia   . Hypogonadism in male   . Hypothyroidism   . Lower urinary tract symptoms (LUTS)   . OSA (obstructive sleep apnea)   . Solitary lung nodule   . TIA (transient ischemic attack)     Past Surgical History:  Procedure Laterality Date  . HERNIA REPAIR Left 2018    Current Medications: Current Meds  Medication Sig  . alfuzosin (UROXATRAL) 10 MG 24 hr tablet Take 1 tablet (10 mg total) by mouth daily with breakfast.  . allopurinol (ZYLOPRIM) 300 MG tablet Take 1 tablet (300 mg total) by mouth daily.  Marland Kitchen. apixaban (ELIQUIS) 5 MG TABS tablet Take 1 tablet (5 mg total) by mouth 2 (two) times daily.  . carvedilol (COREG) 3.125 MG tablet Take 1.5 tablets by mouth twice daily  . clonazePAM (KLONOPIN) 1 MG tablet Take 1 tablet (1 mg total) by mouth daily.  . famotidine (PEPCID) 20 MG tablet Take 1 tablet (20 mg total) by mouth at bedtime.  . gabapentin (NEURONTIN) 300 MG capsule Take 300 mg by mouth as needed.  Marland Kitchen. levothyroxine (SYNTHROID, LEVOTHROID) 112 MCG tablet Take 1 tablet (112 mcg total) by mouth daily before breakfast.  . pantoprazole (PROTONIX) 20 MG tablet Take 1 tablet (20 mg total) by mouth 2 (two) times daily before a meal.  . simvastatin (ZOCOR) 40 MG tablet Take 1 tablet (40 mg total) by mouth daily.     Allergies:   Patient has no known allergies.   Social History   Socioeconomic History  . Marital status: Single    Spouse name: Not on file  . Number of children: 0  . Years of education: Not on file  . Highest education level: Not on file  Occupational History  . 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 Financial resource strain: Not on file  . Food insecurity:    Worry: Not on file    Inability: Not on file  . Transportation needs:    Medical: Not on file    Non-medical: Not on file  Tobacco Use  . Smoking status: Former Smoker    Last attempt to quit: 10/19/2012    Years since  quitting: 5.3  . Smokeless tobacco: Never Used  . Tobacco comment: 1-2 ppd  Substance and Sexual Activity  . Alcohol use: Yes    Comment: Social- 2 drinks per day normally  . Drug use: Never  . Sexual activity: Not Currently  Lifestyle  . Physical activity:    Days per week: Not on file    Minutes per session: Not on file  . Stress: Not on file  Relationships  . Social connections:    Talks on phone: Not on file    Gets together: Not on file    Attends religious service: Not on file    Active member of club  or organization: Not on file    Attends meetings of clubs or organizations: Not on file    Relationship status: Not on file  Other Topics Concern  . Not on file  Social History Narrative   Moved from [place] to [place], 09/2017   Sister is Christain SacramentoBarbara Rosseti      Family History: The patient's family history includes CAD in his maternal grandfather; COPD in his father; Scleroderma in his mother. There is no history of Colon cancer or Prostate cancer. ROS:   Please see the history of present illness.    All other systems reviewed and are negative.  EKGs/Labs/Other Studies Reviewed:    The following studies were reviewed today:  EKG:  EKG ordered today.  The ekg ordered today demonstrates sinus rhythm incomplete right bundle branch block normal ST-T and QT interval.  Recent Labs: 10/19/2017: ALT 19; BUN 19; Creatinine, Ser 1.04; Hemoglobin 15.6; Platelets 126.0; Potassium 3.9; Sodium 143 03/02/2018: TSH 1.81  Recent Lipid Panel    Component Value Date/Time   CHOL 159 05/21/2017   TRIG 155 05/21/2017   HDL 47 05/21/2017   LDLCALC 81 05/21/2017    Physical Exam:    VS:  BP 126/80   Pulse 95   Ht 6' (1.829 m)   Wt 259 lb 6.4 oz (117.7 kg)   SpO2 95%   BMI 35.18 kg/m     Wt Readings from Last 3 Encounters:  03/10/18 259 lb 6.4 oz (117.7 kg)  03/02/18 266 lb 4 oz (120.8 kg)  02/16/18 253 lb (114.8 kg)     GEN:  Well nourished, well developed in no acute  distress HEENT: Normal NECK: No JVD; No carotid bruits LYMPHATICS: No lymphadenopathy CARDIAC: RRR, no murmurs, rubs, gallops RESPIRATORY:  Clear to auscultation without rales, wheezing or rhonchi  ABDOMEN: Soft, non-tender, non-distended MUSCULOSKELETAL:  No edema; No deformity  SKIN: Warm and dry NEUROLOGIC:  Alert and oriented x 3 PSYCHIATRIC:  Normal affect    Signed, Norman HerrlichBrian , MD  03/10/2018 12:21 PM    [facility]

## 2018-03-10 ENCOUNTER — Encounter: Payer: Self-pay | Admitting: Cardiology

## 2018-03-10 ENCOUNTER — Telehealth: Payer: Self-pay | Admitting: Internal Medicine

## 2018-03-10 ENCOUNTER — Ambulatory Visit (INDEPENDENT_AMBULATORY_CARE_PROVIDER_SITE_OTHER): Payer: Medicare HMO | Admitting: Cardiology

## 2018-03-10 VITALS — BP 126/80 | HR 95 | Ht 72.0 in | Wt 259.4 lb

## 2018-03-10 DIAGNOSIS — E039 Hypothyroidism, unspecified: Secondary | ICD-10-CM

## 2018-03-10 DIAGNOSIS — R079 Chest pain, unspecified: Secondary | ICD-10-CM | POA: Diagnosis not present

## 2018-03-10 DIAGNOSIS — I480 Paroxysmal atrial fibrillation: Secondary | ICD-10-CM

## 2018-03-10 DIAGNOSIS — Z7901 Long term (current) use of anticoagulants: Secondary | ICD-10-CM

## 2018-03-10 MED ORDER — AMIODARONE HCL 200 MG PO TABS: ORAL_TABLET | ORAL | 0 refills | Status: DC

## 2018-03-10 MED ORDER — CARVEDILOL 3.125 MG PO TABS: 3.1250 mg | ORAL_TABLET | Freq: Three times a day (TID) | ORAL | 3 refills | Status: DC

## 2018-03-10 NOTE — Telephone Encounter (Signed)
Copied from CRM [id number]. Topic: Quick Communication - See Telephone Encounter >> Mar 10, 2018  2:58 PM Jens SomMedley, Jennifer A wrote: CRM for notification. See Telephone encounter for: 03/10/18.[facility] [place]  is calling regarding carvedilol (COREG) 3.125 MG tablet [[id number]][[id number]]  it is too small to cut in half.  The patient has talked to the provider next dosage is  6.25. Can he take this 2times per day. [phone]

## 2018-03-10 NOTE — Telephone Encounter (Signed)
Pt dropped off [employer] form that needs to be signed by provider. He states he needs it done ASAP. I let him know our policy of 5-7 business days for paperwork. He asked for the form to be faxed [phone] and to call him when it is done [phone]. Form taken to Sharon's office for Dr. Drue NovelPaz.

## 2018-03-10 NOTE — Telephone Encounter (Signed)
Unfortunately, he has not been satisfied after almost any interaction we have had.  He might need to seek care elsewhere.

## 2018-03-10 NOTE — Patient Instructions (Addendum)
Medication Instructions:  Your physician has recommended you make the following change in your medication:  START amiodarone (pacerone) 200 mg: Take 2 tablets daily for 2 weeks then take 1 tablet daily  If you need a refill on your cardiac medications before your next appointment, please call your pharmacy.   Lab work: Your physician recommends that you return for lab work within 3-7 days before cardiac CTA: BMP. Please return to our office for lab work, no appointment needed. No need to fast beforehand.   If you have labs (blood work) drawn today and your tests are completely normal, you will receive your results only by: Marland Kitchen. MyChart Message (if you have MyChart) OR . A paper copy in the mail If you have any lab test that is abnormal or we need to change your treatment, we will call you to review the results.  Testing/Procedures: You had an EKG today.   Your physician has requested that you have cardiac CT. Cardiac computed tomography (CT) is a painless test that uses an x-ray machine to take clear, detailed pictures of your heart. For further information please visit [email or web address]. Please follow instruction sheet as given.   Please arrive at the [facility] main entrance of [facility] at xx:xx AM (30-45 minutes prior to test start time)  [facility] [address] [place], [place] [address] [phone]  Proceed to the [facility] Radiology Department (First Floor).  Please follow these instructions carefully (unless otherwise directed):  Hold all erectile dysfunction medications at least 48 hours prior to test.  On the Night Before the Test: . Be sure to Drink plenty of water. . Do not consume any caffeinated/decaffeinated beverages or chocolate 12 hours prior to your test. . Do not take any antihistamines 12 hours prior to your test.  On the Day of the Test: . Drink plenty of water. Do not drink any water within one hour of the test. . Do not eat  any food 4 hours prior to the test. . You may take your regular medications prior to the test.  . Take your carvedilol (coreg) two hours prior to test.                  -If HR is less than 55 BPM- No Beta Blocker                -IF HR is greater than 55 BPM and patient is less than or equal to 75 yrs old Lopressor 100mg  x1.                -If HR is greater than 55 BPM and patient is greater than 75 yrs old Lopressor 50 mg x1.          After the Test: . Drink plenty of water. . After receiving IV contrast, you may experience a mild flushed feeling. This is normal. . On occasion, you may experience a mild rash up to 24 hours after the test. This is not dangerous. If this occurs, you can take Benadryl 25 mg and increase your fluid intake. . If you experience trouble breathing, this can be serious. If it is severe call 911 IMMEDIATELY. If it is mild, please call our office.  You will be scheduled for a nurse visit in 1 week to have an EKG.   Follow-Up: At [facility], you and your health needs are our priority.  As part of our continuing mission to provide you with exceptional heart  care, we have created designated Provider Care Teams.  These Care Teams include your primary Cardiologist (physician) and Advanced Practice Providers (APPs -  Physician Assistants and Nurse Practitioners) who all work together to provide you with the care you need, when you need it. You will need a follow up appointment in 6 weeks.       Cardiac CT Angiogram  A cardiac CT angiogram is a procedure to look at the heart and the area around the heart. It may be done to help find the cause of chest pains or other symptoms of heart disease. During this procedure, a large X-ray machine, called a CT scanner, takes detailed pictures of the heart and the surrounding area after a dye (contrast material) has been injected into blood vessels in the area. The procedure is also sometimes called a coronary CT angiogram,  coronary artery scanning, or CTA. A cardiac CT angiogram allows the health care provider to see how well blood is flowing to and from the heart. The health care provider will be able to see if there are any problems, such as:  Blockage or narrowing of the coronary arteries in the heart.  Fluid around the heart.  Signs of weakness or disease in the muscles, valves, and tissues of the heart. Tell a health care provider about:  Any allergies you have. This is especially important if you have had a previous allergic reaction to contrast dye.  All medicines you are taking, including vitamins, herbs, eye drops, creams, and over-the-counter medicines.  Any blood disorders you have.  Any surgeries you have had.  Any medical conditions you have.  Whether you are pregnant or may be pregnant.  Any anxiety disorders, chronic pain, or other conditions you have that may increase your stress or prevent you from lying still. What are the risks? Generally, this is a safe procedure. However, problems may occur, including:  Bleeding.  Infection.  Allergic reactions to medicines or dyes.  Damage to other structures or organs.  Kidney damage from the dye or contrast that is used.  Increased risk of cancer from radiation exposure. This risk is low. Talk with your health care provider about: ? The risks and benefits of testing. ? How you can receive the lowest dose of radiation. What happens before the procedure?  Wear comfortable clothing and remove any jewelry, glasses, dentures, and hearing aids.  Follow instructions from your health care provider about eating and drinking. This may include: ? For 12 hours before the test - avoid caffeine. This includes tea, coffee, soda, energy drinks, and diet pills. Drink plenty of water or other fluids that do not have caffeine in them. Being well-hydrated can prevent complications. ? For 4-6 hours before the test - stop eating and drinking. The contrast  dye can cause nausea, but this is less likely if your stomach is empty.  Ask your health care provider about changing or stopping your regular medicines. This is especially important if you are taking diabetes medicines, blood thinners, or medicines to treat erectile dysfunction. What happens during the procedure?  Hair on your chest may need to be removed so that small sticky patches called electrodes can be placed on your chest. These will transmit information that helps to monitor your heart during the test.  An IV tube will be inserted into one of your veins.  You might be given a medicine to control your heart rate during the test. This will help to ensure that good images are obtained.  You will be asked to lie on an exam table. This table will slide in and out of the CT machine during the procedure.  Contrast dye will be injected into the IV tube. You might feel warm, or you may get a metallic taste in your mouth.  You will be given a medicine (nitroglycerin) to relax (dilate) the arteries in your heart.  The table that you are lying on will move into the CT machine tunnel for the scan.  The person running the machine will give you instructions while the scans are being done. You may be asked to: ? Keep your arms above your head. ? Hold your breath. ? Stay very still, even if the table is moving.  When the scanning is complete, you will be moved out of the machine.  The IV tube will be removed. The procedure may vary among health care providers and hospitals. What happens after the procedure?  You might feel warm, or you may get a metallic taste in your mouth from the contrast dye.  You may have a headache from the nitroglycerin.  After the procedure, drink water or other fluids to wash (flush) the contrast material out of your body.  Contact a health care provider if you have any symptoms of allergy to the contrast. These symptoms include: ? Shortness of breath. ? Rash  or hives. ? A racing heartbeat.  Most people can return to their normal activities right after the procedure. Ask your health care provider what activities are safe for you.  It is up to you to get the results of your procedure. Ask your health care provider, or the department that is doing the procedure, when your results will be ready. Summary  A cardiac CT angiogram is a procedure to look at the heart and the area around the heart. It may be done to help find the cause of chest pains or other symptoms of heart disease.  During this procedure, a large X-ray machine, called a CT scanner, takes detailed pictures of the heart and the surrounding area after a dye (contrast material) has been injected into blood vessels in the area.  Ask your health care provider about changing or stopping your regular medicines before the procedure. This is especially important if you are taking diabetes medicines, blood thinners, or medicines to treat erectile dysfunction.  After the procedure, drink water or other fluids to wash (flush) the contrast material out of your body. This information is not intended to replace advice given to you by your health care provider. Make sure you discuss any questions you have with your health care provider. Document Released: 02/13/2008 Document Revised: 01/20/2016 Document Reviewed: 01/20/2016 Elsevier Interactive Patient Education  2019 Elsevier Inc.   Amiodarone tablets What is this medicine? AMIODARONE (a MEE oh da rone) is an antiarrhythmic drug. It helps make your heart beat regularly. Because of the side effects caused by this medicine, it is only used when other medicines have not worked. It is usually used for heartbeat problems that may be life threatening. This medicine may be used for other purposes; ask your health care provider or pharmacist if you have questions. COMMON BRAND NAME(S): Cordarone, Pacerone What should I tell my health care provider before I  take this medicine? They need to know if you have any of these conditions: -liver disease -lung disease -other heart problems -thyroid disease -an unusual or allergic reaction to amiodarone, iodine, other medicines, foods, dyes, or preservatives -pregnant or trying to  get pregnant -breast-feeding How should I use this medicine? Take this medicine by mouth with a glass of water. Follow the directions on the prescription label. You can take this medicine with or without food. However, you should always take it the same way each time. Take your doses at regular intervals. Do not take your medicine more often than directed. Do not stop taking except on the advice of your doctor or health care professional. A special MedGuide will be given to you by the pharmacist with each prescription and refill. Be sure to read this information carefully each time. Talk to your pediatrician regarding the use of this medicine in children. Special care may be needed. Overdosage: If you think you have taken too much of this medicine contact a poison control center or emergency room at once. NOTE: This medicine is only for you. Do not share this medicine with others. What if I miss a dose? If you miss a dose, take it as soon as you can. If it is almost time for your next dose, take only that dose. Do not take double or extra doses. What may interact with this medicine? Do not take this medicine with any of the following medications: -abarelix -apomorphine -arsenic trioxide -certain antibiotics like erythromycin, gemifloxacin, levofloxacin, pentamidine -certain medicines for depression like amoxapine, tricyclic antidepressants -certain medicines for fungal infections like fluconazole, itraconazole, ketoconazole, posaconazole, voriconazole -certain medicines for irregular heart beat like disopyramide, dofetilide, dronedarone, ibutilide, propafenone, sotalol -certain medicines for malaria like chloroquine,  halofantrine -cisapride -droperidol -haloperidol -hawthorn -maprotiline -methadone -phenothiazines like chlorpromazine, mesoridazine, thioridazine -pimozide -ranolazine -red yeast rice -vardenafil -ziprasidone This medicine may also interact with the following medications: -antiviral medicines for HIV or AIDS -certain medicines for blood pressure, heart disease, irregular heart beat -certain medicines for cholesterol like atorvastatin, cerivastatin, lovastatin, simvastatin -certain medicines for hepatitis C like sofosbuvir and ledipasvir; sofosbuvir -certain medicines for seizures like phenytoin -certain medicines for thyroid problems -certain medicines that treat or prevent blood clots like warfarin -cholestyramine -cimetidine -clopidogrel -cyclosporine -dextromethorphan -diuretics -fentanyl -general anesthetics -grapefruit juice -lidocaine -loratadine -methotrexate -other medicines that prolong the QT interval (cause an abnormal heart rhythm) -procainamide -quinidine -rifabutin, rifampin, or rifapentine -St. John's Wort -trazodone This list may not describe all possible interactions. Give your health care provider a list of all the medicines, herbs, non-prescription drugs, or dietary supplements you use. Also tell them if you smoke, drink alcohol, or use illegal drugs. Some items may interact with your medicine. What should I watch for while using this medicine? Your condition will be monitored closely when you first begin therapy. Often, this drug is first started in a hospital or other monitored health care setting. Once you are on maintenance therapy, visit your doctor or health care professional for regular checks on your progress. Because your condition and use of this medicine carry some risk, it is a good idea to carry an identification card, necklace or bracelet with details of your condition, medications, and doctor or health care professional. Bonita QuinYou may get drowsy  or dizzy. Do not drive, use machinery, or do anything that needs mental alertness until you know how this medicine affects you. Do not stand or sit up quickly, especially if you are an older patient. This reduces the risk of dizzy or fainting spells. This medicine can make you more sensitive to the sun. Keep out of the sun. If you cannot avoid being in the sun, wear protective clothing and use sunscreen. Do not use sun lamps or  tanning beds/booths. You should have regular eye exams before and during treatment. Call your doctor if you have blurred vision, see halos, or your eyes become sensitive to light. Your eyes may get dry. It may be helpful to use a lubricating eye solution or artificial tears solution. If you are going to have surgery or a procedure that requires contrast dyes, tell your doctor or health care professional that you are taking this medicine. What side effects may I notice from receiving this medicine? Side effects that you should report to your doctor or health care professional as soon as possible: -allergic reactions like skin rash, itching or hives, swelling of the face, lips, or tongue -blue-gray coloring of the skin -blurred vision, seeing blue green halos, increased sensitivity of the eyes to light -breathing problems -chest pain -dark urine -fast, irregular heartbeat -feeling faint or light-headed -intolerance to heat or cold -nausea or vomiting -pain and swelling of the scrotum -pain, tingling, numbness in feet, hands -redness, blistering, peeling or loosening of the skin, including inside the mouth -spitting up blood -stomach pain -sweating -unusual or uncontrolled movements of body -unusually weak or tired -weight gain or loss -yellowing of the eyes or skin Side effects that usually do not require medical attention (report to your doctor or health care professional if they continue or are bothersome): -change in sex drive or  performance -constipation -dizziness -headache -loss of appetite -trouble sleeping This list may not describe all possible side effects. Call your doctor for medical advice about side effects. You may report side effects to FDA at 1-800-FDA-1088. Where should I keep my medicine? Keep out of the reach of children. Store at room temperature between 20 and 25 degrees C (68 and 77 degrees F). Protect from light. Keep container tightly closed. Throw away any unused medicine after the expiration date. NOTE: This sheet is a summary. It may not cover all possible information. If you have questions about this medicine, talk to your doctor, pharmacist, or health care provider.  2019 Elsevier/Gold Standard (2013-06-05 19:48:11)

## 2018-03-10 NOTE — Telephone Encounter (Signed)
Patient called the PEC back from the pharmacy very upset wanting medication dosage "corrected".  Patient advised that the dosage needed to be half and that we were under the impression the high dose was 5 MG when in fact it is 6.25 MG.  Patient frustrated as he cannot cut down to 2.5 MG because half is 3.125 MG.  Asked Dr. Drue NovelPaz about medication correction but he was unaware.  Irving BurtonEmily, RN Supervisor, advised that the was working on it and would have it done by 430p.  I advised the patient of this and he became even more upset advising that he'd been at the pharmacy the entire time.  He stated he "will just pick up what was called in and take 4 of them each day and just be done with it".  Advised Environmental education officerN supervisor.

## 2018-03-10 NOTE — Telephone Encounter (Signed)
Author spoke with Dr. Drue NovelPaz in the middle of patient care regarding coreg 1.5 tablet order to offer alternative, as pt. has reportedly been waiting at the pharmacy for hours per [facility] and practice administrator, Carollee HerterShannon, for some reason demanding new medication to be sent in. Author received call from [facility],Shana, regarding pt. being irate regarding medication at 1536, and author in the middle of pt. care, but stated that Thereasa Parkinauthor would address concern by 430PM today, and to notify pt. about the status. At 1600, pt. reportedly on phone with practice administrator upset that he had not heard anything. Author just finished with pt. care and had opportunity to speak with Dr. Drue NovelPaz. Dr. Drue NovelPaz agreed to change order to coreg 3.125mg  tid with meals, so that pt. does not have to worry about cutting tablets. Author phoned pt. to make aware, left detailed VM relaying, apologizing for delay but also reminding pt. that patients are currently being seen by providers and nursing staff alike and we cannot always promptly respond to medication demands as we would like. Author stated she would let [facility] know of change, as well as alternate contact on DPR Barbara. Pt. returned call within a few minutes, and explained that he was told by Dr. Drue NovelPaz on 12/18 OV that Dr. Drue NovelPaz would "order the next dosage up", and pt. feels betrayed that the 6.125mg  had not been ordered that day, but the 3.125mg  was ordered, and pt. cannot cut the pills. Author stated that there must have been some misunderstanding regarding the change, and  reiterated that now the order has been changed to avoid issue of cutting pills, and pt. verbalized understanding but did not want to hear anymore. Pt. was upset that Thereasa Parkinauthor offered to contact sister to notify as that was a "HIPPAA breach, as this is not an emergency", per pt. even though sister is on DPR. When author offered to have Dr. Drue NovelPaz reach out to him, pt. stated "No, I'm done, I don't want to talk to him".  Routed to Dr. Sandra CockaynePaz, Kaylyn, and Carollee HerterShannon as Lorain ChildesFYI.

## 2018-03-14 NOTE — Telephone Encounter (Signed)
Completed as much as possible on form; forwarded to provider/SLS 12/30

## 2018-03-16 DIAGNOSIS — Z79899 Other long term (current) drug therapy: Secondary | ICD-10-CM | POA: Insufficient documentation

## 2018-03-16 NOTE — Progress Notes (Signed)
Cardiology Office Note:    Date:  03/17/2018   ID:  Samuel Frey, DOB 1940-10-26, MRN [id number]  PCP:  Wanda PlumpPaz, Jose E, MD  Cardiologist:  Norman HerrlichBrian Munley, MD    Referring MD: Wanda PlumpPaz, Jose E, MD    ASSESSMENT:    1. Paroxysmal atrial fibrillation (HCC)   2. On amiodarone therapy   3. Chronic anticoagulation   4. Chest pain in adult    PLAN:    In order of problems listed above:  1. Stable in sinus rhythm continue amiodarone 2. Continue amiodarone I made arrangements for follow-up CMP and TSH 3. Continue his anticoagulant 4. Awaiting cardiac CTA I suspect he does indeed have CAD with elevated troponin and chest pain with rapid atrial fibrillation   Next appointment: 3 months   Medication Adjustments/Labs and Tests Ordered: Current medicines are reviewed at length with the patient today.  Concerns regarding medicines are outlined above.  No orders of the defined types were placed in this encounter.  No orders of the defined types were placed in this encounter.   Chief Complaint  Patient presents with  . Follow-up    after starting amiodarone for PAF     History of Present Illness:    Samuel RudeChristopher G Mcclenahan is a 78 y.o. male with a hx of PAF last seen by Dr Consuello Bossierevanker 02/16/18 and by me 03/10/18 with typical angina and referred for cardiac CTA. Compliance with diet, lifestyle and medications: Yes  I want him to return for an EKG but he is but has an office visit.  He has been taken amiodarone no episodes of atrial fibrillation chest pain feels improved and is committed to having his cardiac CTA.  He tells me he is no longer seeing his primary care physician searching for another and then inquires about having abdominal pain brief momentary localized in the right lower quadrant I told him it is outside my field of practice but I did examine his abdomen he had no tenderness no mass and no obvious hernia.  He is not having GI or GU symptoms I told him the next appointment  with primary care this should address the issue I made arrangements to follow-up CMP and TSH as he does not have a PCP at this time and I will see him more frequently in 3 months after his cardiac CTA for follow-up on amiodarone therapy Past Medical History:  Diagnosis Date  . Arthritis   . Blood transfusion without reported diagnosis   . Emphysema of lung (HCC)   . GERD (gastroesophageal reflux disease)   . Gout   . Herpes simplex   . Hx of colonic polyps   . Hx of diverticulitis of colon   . Hyperglycemia   . Hypogonadism in male   . Hypothyroidism   . Lower urinary tract symptoms (LUTS)   . OSA (obstructive sleep apnea)   . Solitary lung nodule   . TIA (transient ischemic attack)     Past Surgical History:  Procedure Laterality Date  . HERNIA REPAIR Left 2018    Current Medications: No outpatient medications have been marked as taking for the 03/17/18 encounter (Office Visit) with Baldo DaubMunley, Brian J, MD.     Allergies:   Patient has no known allergies.   Social History   Socioeconomic History  . Marital status: Single    Spouse name: Not on file  . Number of children: 0  . Years of education: Not on file  . Highest education level: Not  on file  Occupational History  . Occupation: retired, Museum/gallery conservator  . Financial resource strain: Not on file  . Food insecurity:    Worry: Not on file    Inability: Not on file  . Transportation needs:    Medical: Not on file    Non-medical: Not on file  Tobacco Use  . Smoking status: Former Smoker    Last attempt to quit: 10/19/2012    Years since quitting: 5.4  . Smokeless tobacco: Never Used  . Tobacco comment: 1-2 ppd  Substance and Sexual Activity  . Alcohol use: Yes    Comment: Social- 2 drinks per day normally  . Drug use: Never  . Sexual activity: Not Currently  Lifestyle  . Physical activity:    Days per week: Not on file    Minutes per session: Not on file  . Stress: Not on file  Relationships  . Social  connections:    Talks on phone: Not on file    Gets together: Not on file    Attends religious service: Not on file    Active member of club or organization: Not on file    Attends meetings of clubs or organizations: Not on file    Relationship status: Not on file  Other Topics Concern  . Not on file  Social History Narrative   Moved from [place] to [place], 09/2017   Sister is Christain [place]      Family History: The patient's family history includes CAD in his maternal grandfather; COPD in his father; Scleroderma in his mother. There is no history of Colon cancer or Prostate cancer. ROS:   Please see the history of present illness.    All other systems reviewed and are negative.  EKGs/Labs/Other Studies Reviewed:    The following studies were reviewed today:  EKG:  EKG ordered today.  The ekg ordered today demonstrates [facility] and normal EKG  Recent Labs: 10/19/2017: ALT 19; BUN 19; Creatinine, Ser 1.04; Hemoglobin 15.6; Platelets 126.0; Potassium 3.9; Sodium 143 03/02/2018: TSH 1.81  Recent Lipid Panel    Component Value Date/Time   CHOL 159 05/21/2017   TRIG 155 05/21/2017   HDL 47 05/21/2017   LDLCALC 81 05/21/2017    Physical Exam:    VS:  There were no vitals taken for this visit.    Wt Readings from Last 3 Encounters:  03/10/18 259 lb 6.4 oz (117.7 kg)  03/02/18 266 lb 4 oz (120.8 kg)  02/16/18 253 lb (114.8 kg)     GEN:  Well nourished, well developed in no acute distress HEENT: Normal NECK: No JVD; No carotid bruits LYMPHATICS: No lymphadenopathy CARDIAC: RRR, no murmurs, rubs, gallops RESPIRATORY:  Clear to auscultation without rales, wheezing or rhonchi  ABDOMEN: Soft, non-tender, non-distended MUSCULOSKELETAL:  No edema; No deformity  SKIN: Warm and dry NEUROLOGIC:  Alert and oriented x 3 PSYCHIATRIC:  Normal affect    Signed, Norman Herrlich, MD  03/17/2018 10:03 AM    [facility]

## 2018-03-17 ENCOUNTER — Ambulatory Visit (INDEPENDENT_AMBULATORY_CARE_PROVIDER_SITE_OTHER): Payer: Medicare Other | Admitting: Cardiology

## 2018-03-17 ENCOUNTER — Ambulatory Visit: Payer: Medicare HMO

## 2018-03-17 DIAGNOSIS — I480 Paroxysmal atrial fibrillation: Secondary | ICD-10-CM

## 2018-03-17 DIAGNOSIS — Z01812 Encounter for preprocedural laboratory examination: Secondary | ICD-10-CM

## 2018-03-17 DIAGNOSIS — Z7901 Long term (current) use of anticoagulants: Secondary | ICD-10-CM | POA: Diagnosis not present

## 2018-03-17 DIAGNOSIS — Z79899 Other long term (current) drug therapy: Secondary | ICD-10-CM

## 2018-03-17 DIAGNOSIS — R079 Chest pain, unspecified: Secondary | ICD-10-CM

## 2018-03-17 NOTE — Patient Instructions (Signed)
Medication Instructions:  Your physician recommends that you continue on your current medications as directed. Please refer to the Current Medication list given to you today.  If you need a refill on your cardiac medications before your next appointment, please call your pharmacy.   Lab work: Your physician recommends that you return for lab work within 3-7 days before your cardiac CTA: TSH, CMP. You can return to our office for lab work, no appointment needed. No need to fast beforehand.   If you have labs (blood work) drawn today and your tests are completely normal, you will receive your results only by: Marland Kitchen MyChart Message (if you have MyChart) OR . A paper copy in the mail If you have any lab test that is abnormal or we need to change your treatment, we will call you to review the results.  Testing/Procedures: You had an EKG today.   Follow-Up: At [facility], you and your health needs are our priority.  As part of our continuing mission to provide you with exceptional heart care, we have created designated Provider Care Teams.  These Care Teams include your primary Cardiologist (physician) and Advanced Practice Providers (APPs -  Physician Assistants and Nurse Practitioners) who all work together to provide you with the care you need, when you need it. You will need a follow up appointment in 3 months.  Please call our office 2 months in advance to schedule this appointment.

## 2018-03-17 NOTE — Addendum Note (Signed)
Addended by: Crist Fat on: 03/17/2018 11:30 AM   Modules accepted: Orders

## 2018-03-19 ENCOUNTER — Other Ambulatory Visit: Payer: Medicare HMO

## 2018-03-19 ENCOUNTER — Ambulatory Visit
Admission: RE | Admit: 2018-03-19 | Discharge: 2018-03-19 | Disposition: A | Discharge status: Home / Self Care | Payer: Medicare HMO | Source: Ambulatory Visit | Attending: Family Medicine | Admitting: Family Medicine

## 2018-03-19 DIAGNOSIS — M79671 Pain in right foot: Secondary | ICD-10-CM

## 2018-03-20 ENCOUNTER — Telehealth (INDEPENDENT_AMBULATORY_CARE_PROVIDER_SITE_OTHER): Payer: Self-pay | Admitting: Family Medicine

## 2018-03-20 ENCOUNTER — Encounter (INDEPENDENT_AMBULATORY_CARE_PROVIDER_SITE_OTHER): Payer: Self-pay | Admitting: Family Medicine

## 2018-03-20 NOTE — Telephone Encounter (Signed)
MRI shows severe plantar fasciitis.  No stress fracture seen.

## 2018-03-21 NOTE — Telephone Encounter (Signed)
I called the patient - scheduled for Wednesday Jan. 8th at 10 am

## 2018-03-22 ENCOUNTER — Telehealth: Payer: Self-pay | Admitting: Internal Medicine

## 2018-03-23 ENCOUNTER — Encounter (INDEPENDENT_AMBULATORY_CARE_PROVIDER_SITE_OTHER): Payer: Self-pay | Admitting: Family Medicine

## 2018-03-23 ENCOUNTER — Ambulatory Visit (INDEPENDENT_AMBULATORY_CARE_PROVIDER_SITE_OTHER): Payer: Medicare Other | Admitting: Family Medicine

## 2018-03-23 DIAGNOSIS — M79671 Pain in right foot: Secondary | ICD-10-CM

## 2018-03-23 DIAGNOSIS — G8929 Other chronic pain: Secondary | ICD-10-CM | POA: Diagnosis not present

## 2018-03-23 MED ORDER — METHYLPREDNISOLONE ACETATE 40 MG/ML IJ SUSP
40.0000 mg | INTRAMUSCULAR | Status: AC | PRN
  Administered 2018-03-23: 40 mg

## 2018-03-23 MED ORDER — LIDOCAINE HCL 1 % IJ SOLN
2.0000 mL | INTRAMUSCULAR | Status: AC | PRN
  Administered 2018-03-23: 2 mL

## 2018-03-23 MED ORDER — CLONAZEPAM 1 MG PO TABS: 1.0000 mg | ORAL_TABLET | Freq: Every day | ORAL | 3 refills | Status: DC

## 2018-03-23 NOTE — Progress Notes (Signed)
   Procedure Note  Patient: Samuel Frey             Date of Birth: [birth year]           MRN: [id number]             Visit Date: 03/23/2018  Procedures: Visit Diagnoses: Chronic heel pain, right  Foot Inj Date/Time: 03/23/2018 10:27 AM Performed by: Lavada Mesi, MD Authorized by: Lavada Mesi, MD   Consent Given by:  Patient Indications:  Fasciitis Condition: Plantar Fasciitis   Location: right plantar fascia muscle   Needle Size:  25 G Medications:  2 mL lidocaine 1 %; 40 mg methylPREDNISolone acetate 40 MG/ML

## 2018-03-23 NOTE — Telephone Encounter (Signed)
Pt is requesting refill on clonazepam.   Last OV: 03/02/2018 Last Fill: 01/11/2018 #30 and 1RF UDS: None  NCCR printed- no discrepancies noted- sent for scanning.

## 2018-03-23 NOTE — Telephone Encounter (Signed)
Sent!

## 2018-03-23 NOTE — Progress Notes (Signed)
Office Visit Note   Patient: Samuel Frey           Date of Birth: 1940/07/06           MRN: [id number] Visit Date: 03/23/2018 Requested by: Wanda Plump, MD [address] [place] [place], [place] [address] PCP: Wanda Plump, MD  Subjective: Chief Complaint  Patient presents with  . Right Foot - Pain    Heel pain - plantar fascia cortisone injection    HPI: He is here for follow-up of chronic right heel pain.  Recent MRI scan showed severe plantar fasciitis with some surrounding edema at the calcaneus but no sign of stress fracture.  In the past he has done well with cortisone injections.  He decided he wants to try one again.  In addition he would like to start coming here for primary care purposes.              ROS: Otherwise noncontributory  Objective: Vital Signs: There were no vitals taken for this visit.  Physical Exam:  Right heel: Extremely tender at the medial origin plantar fascia at the calcaneus.  Also a little bit tender at the Achilles insertion posteriorly.  Imaging: Ultrasound was used to confirm area of maximum plantar fascia swelling but images were not recorded or billed for.  Assessment & Plan: 1.  Chronic right heel pain due to severe plantar fasciitis -Discussed with patient and he wants to proceed with injection.  Could repeat the injection if only partial relief.  I anticipate able take at least a few weeks to get relief due to the surrounding bone marrow edema.   Follow-Up Instructions: No follow-ups on file.      Procedures: Right heel injection: After sterile prep with Betadine, injected 2 cc 1% lidocaine without epinephrine and 40 mg of methylprednisolone into the area of maximum tenderness and plantar fascia swelling.  Patient tolerated this without complication or side effects immediately, and had some improvement in pain.     PMFS History: Patient Active Problem List   Diagnosis Date Noted  . On amiodarone therapy  03/16/2018  . Chest pain in adult 03/10/2018  . Paroxysmal atrial fibrillation (HCC) 01/30/2018  . PCP notes >>>>>>>> 11/17/2017  . RLS (restless legs syndrome) 11/17/2017  . Hyperglycemia   . Emphysema of lung (HCC)   . TIA (transient ischemic attack)   . Hypothyroidism   . Lower urinary tract symptoms (LUTS)   . DJD , spinal stenosis   . Lung nodules 10/19/2017   Past Medical History:  Diagnosis Date  . Arthritis   . Blood transfusion without reported diagnosis   . Emphysema of lung (HCC)   . GERD (gastroesophageal reflux disease)   . Gout   . Herpes simplex   . Hx of colonic polyps   . Hx of diverticulitis of colon   . Hyperglycemia   . Hypogonadism in male   . Hypothyroidism   . Lower urinary tract symptoms (LUTS)   . OSA (obstructive sleep apnea)   . Solitary lung nodule   . TIA (transient ischemic attack)     Family History  Problem Relation Age of Onset  . COPD Father   . CAD Maternal Grandfather   . Scleroderma Mother   . Colon cancer Neg Hx   . Prostate cancer Neg Hx     Past Surgical History:  Procedure Laterality Date  . HERNIA REPAIR Left 2018   Social History   Occupational History  .  Occupation: retired, Chief Financial Officer  Tobacco Use  . Smoking status: Former Smoker    Last attempt to quit: 10/19/2012    Years since quitting: 5.4  . Smokeless tobacco: Never Used  . Tobacco comment: 1-2 ppd  Substance and Sexual Activity  . Alcohol use: Yes    Comment: Social- 2 drinks per day normally  . Drug use: Never  . Sexual activity: Not Currently

## 2018-03-24 ENCOUNTER — Encounter (INDEPENDENT_AMBULATORY_CARE_PROVIDER_SITE_OTHER): Payer: Self-pay | Admitting: Family Medicine

## 2018-03-25 ENCOUNTER — Telehealth: Payer: Self-pay | Admitting: Cardiology

## 2018-03-25 ENCOUNTER — Telehealth: Payer: Self-pay

## 2018-03-25 MED ORDER — APIXABAN 5 MG PO TABS: 5.0000 mg | ORAL_TABLET | Freq: Two times a day (BID) | ORAL | 3 refills | Status: DC

## 2018-03-25 NOTE — Telephone Encounter (Signed)
Copied from CRM [id number]. Topic: General - Other >> Mar 25, 2018 12:54 PM Jilda Roche wrote: Reason for CRM: Veronica from [facility] is calling about the patients apixaban (ELIQUIS) 5 MG TABS tablet, they need to know how many mgs it is because they don't show the 5mg , please advise. The dosage was left off. She states the hard copy can be faxed, [phone]  Best call  back is [phone]

## 2018-03-25 NOTE — Telephone Encounter (Signed)
Checked with the cardiac CTA scheduler, Omar Person, who states she will contact patient to schedule his appointment this afternoon.

## 2018-03-25 NOTE — Telephone Encounter (Signed)
Rx printed and faxed to [facility] at number provided.

## 2018-03-25 NOTE — Telephone Encounter (Signed)
Patient still waiting on CTA appt, wants to know if it can be scheduled at [facility] imaging so he can get it done quicker? Please advise.

## 2018-03-28 NOTE — Telephone Encounter (Signed)
Spoke with Jasmine December today who informed me that she had left patient a voicemail on Friday to return her call to schedule but has not heard anything back. Jasmine December will call patient again today.

## 2018-03-28 NOTE — Telephone Encounter (Signed)
Patient has been scheduled for his cardiac CTA on 04/08/2018 at 12:30 pm.

## 2018-03-31 ENCOUNTER — Other Ambulatory Visit: Payer: Self-pay | Admitting: Cardiology

## 2018-04-02 LAB — COMPREHENSIVE METABOLIC PANEL
ALT: 32 IU/L (ref 0–44)
AST: 32 IU/L (ref 0–40)
Albumin/Globulin Ratio: 1.8 (ref 1.2–2.2)
Albumin: 4.4 g/dL (ref 3.5–4.8)
Alkaline Phosphatase: 78 IU/L (ref 39–117)
BUN/Creatinine Ratio: 18 (ref 10–24)
BUN: 20 mg/dL (ref 8–27)
Bilirubin Total: 0.6 mg/dL (ref 0.0–1.2)
CALCIUM: 9.8 mg/dL (ref 8.6–10.2)
CO2: 22 mmol/L (ref 20–29)
CREATININE: 1.14 mg/dL (ref 0.76–1.27)
Chloride: 104 mmol/L (ref 96–106)
GFR calc Af Amer: 72 mL/min/{1.73_m2} (ref >59)
GFR, EST NON AFRICAN AMERICAN: 63 mL/min/{1.73_m2} (ref >59)
GLOBULIN, TOTAL: 2.4 g/dL (ref 1.5–4.5)
Glucose: 108 mg/dL — ABNORMAL HIGH (ref 65–99)
Potassium: 4.5 mmol/L (ref 3.5–5.2)
Sodium: 145 mmol/L — ABNORMAL HIGH (ref 134–144)
Total Protein: 6.8 g/dL (ref 6.0–8.5)

## 2018-04-02 LAB — TSH: TSH: 3.47 u[IU]/mL (ref 0.450–4.500)

## 2018-04-04 NOTE — Telephone Encounter (Signed)
[employer] [facility] declined Pt assistance because product is covered by [employer]. Denial letter sent for scanning.

## 2018-04-06 ENCOUNTER — Encounter (INDEPENDENT_AMBULATORY_CARE_PROVIDER_SITE_OTHER): Payer: Self-pay | Admitting: Family Medicine

## 2018-04-07 ENCOUNTER — Telehealth (HOSPITAL_COMMUNITY): Payer: Self-pay | Admitting: Emergency Medicine

## 2018-04-07 NOTE — Telephone Encounter (Signed)
Reaching out to patient to offer assistance regarding upcoming cardiac imaging study; pt verbalizes understanding of appt date/time, parking situation and where to check in, pre-test NPO status and medications ordered, and verified current allergies; name and call back number provided for further questions should they arise Rockwell Alexandria RN Navigator Cardiac Imaging [facility] [phone] office [phone] cell  Will take carvedilol 2 hr prior to scan

## 2018-04-08 ENCOUNTER — Ambulatory Visit (HOSPITAL_COMMUNITY)
Admission: RE | Admit: 2018-04-08 | Discharge: 2018-04-08 | Disposition: A | Discharge status: Home / Self Care | Payer: Medicare Other | Source: Ambulatory Visit | Attending: Cardiology | Admitting: Cardiology

## 2018-04-08 DIAGNOSIS — R079 Chest pain, unspecified: Secondary | ICD-10-CM

## 2018-04-08 DIAGNOSIS — K760 Fatty (change of) liver, not elsewhere classified: Secondary | ICD-10-CM | POA: Insufficient documentation

## 2018-04-08 DIAGNOSIS — I700 Atherosclerosis of aorta: Secondary | ICD-10-CM | POA: Diagnosis not present

## 2018-04-08 DIAGNOSIS — I2510 Atherosclerotic heart disease of native coronary artery without angina pectoris: Secondary | ICD-10-CM | POA: Diagnosis not present

## 2018-04-08 MED ORDER — NITROGLYCERIN 0.4 MG SL SUBL
SUBLINGUAL_TABLET | SUBLINGUAL | Status: AC
  Filled 2018-04-08: qty 2

## 2018-04-08 MED ORDER — IOPAMIDOL (ISOVUE-370) INJECTION 76%
100.0000 mL | Freq: Once | INTRAVENOUS | Status: AC | PRN
  Administered 2018-04-08: 100 mL via INTRAVENOUS

## 2018-04-08 MED ORDER — METOPROLOL TARTRATE 5 MG/5ML IV SOLN
INTRAVENOUS | Status: AC
  Filled 2018-04-08: qty 15

## 2018-04-08 MED ORDER — NITROGLYCERIN 0.4 MG SL SUBL
0.8000 mg | SUBLINGUAL_TABLET | Freq: Once | SUBLINGUAL | Status: AC
  Administered 2018-04-08: 0.8 mg via SUBLINGUAL
  Filled 2018-04-08: qty 25

## 2018-04-11 ENCOUNTER — Other Ambulatory Visit (INDEPENDENT_AMBULATORY_CARE_PROVIDER_SITE_OTHER): Payer: Self-pay | Admitting: Family Medicine

## 2018-04-11 DIAGNOSIS — I480 Paroxysmal atrial fibrillation: Secondary | ICD-10-CM

## 2018-04-11 MED ORDER — CARVEDILOL 6.25 MG PO TABS: 6.2500 mg | ORAL_TABLET | Freq: Two times a day (BID) | ORAL | 1 refills | Status: DC

## 2018-04-12 ENCOUNTER — Encounter (INDEPENDENT_AMBULATORY_CARE_PROVIDER_SITE_OTHER): Payer: Self-pay | Admitting: Family Medicine

## 2018-04-25 NOTE — Progress Notes (Signed)
Cardiology Office Note:    Date:  04/26/2018   ID:  Samuel Frey, DOB October 09, 1940, MRN [id number]  PCP:  Lavada MesiHilts, Michael, MD  Cardiologist:  Norman HerrlichBrian , MD    Referring MD: Wanda PlumpPaz, Jose E, MD    ASSESSMENT:    1. Coronary artery disease of native artery of native heart with stable angina pectoris (HCC)   2. Paroxysmal atrial fibrillation (HCC)   3. On amiodarone therapy   4. TIA (transient ischemic attack)   5. Hypothyroidism, unspecified type    PLAN:    In order of problems listed above:  1. His CAD is chronic stable and after discussion of his illness options benefits and risk of medical versus revascularization therapy will continue his current medical regimen monitor for angina at this time we will not refer him for revascularization as he has known proximal LAD stenosis and clinically his flow reserve determination is disproportionate to his left circumflex stenosis.  Patient sister voiced understanding and compliance with plan for medical therapy with anticoagulant statin beta-blocker and nitroglycerin. 2. Stable continue low-dose amiodarone and check CMP and TSH regarding toxicity 3. Stable continue his amiodarone 4. Stable 5. Stable continue his current thyroid supplement 6. Stable anticoagulation continue   Next appointment: 6 months   Medication Adjustments/Labs and Tests Ordered: Current medicines are reviewed at length with the patient today.  Concerns regarding medicines are outlined above.  Orders Placed This Encounter  Procedures  . Comprehensive Metabolic Panel (CMET)  . TSH   No orders of the defined types were placed in this encounter.   Chief Complaint  Patient presents with  . Follow-up    after CCTA  . Coronary Artery Disease  . Atrial Fibrillation    History of Present Illness:    Samuel Frey is a 78 y.o. male with a hx of PAF on amiodarone and CAD last seen 03/17/18. Compliance with diet, lifestyle and medications:  yes  Cardiac CTA 04/08/18:   IMPRESSION: 1. Calcium score 385 involving all 3 major epicardial vessels. This is 5559 th percentile for age and sex 2. Moderate 3 vessel CAD Worst lesion appears to be mid LAD 50-75% calcific disease and less than 50% calcific stenosis in the proximal and mid LCF and proximal RCA 3. FFR is abnormal for both LAD distal and LCF 4.  Normal aortic root 3.5 cm  Is seen along with his sister.  We reviewed the results of his cardiac CTA and showed the presence of moderate stenosis in the mid LAD mild CAD in the other vessels but flow reserve was diminished.  He came to attention with an elevated troponin in the setting of rapid atrial fibrillation and since then has had no anginal discomfort on medical therapy.  His coronary anatomy is not high risk and after discussion of options benefits and risks we will continue medical therapy monitor closely for angina and if his symptoms are controlled at this time I do not think he requires revascularization.  The patient and his sister voiced understanding.  Clinically I suspect that the flow reserve is disproportionately abnormal for the left circumflex coronary artery and his LAD disease is not proximal.  Patient is willing to continue current medical treatment and amiodarone which is effective in preventing recurrent atrial fibrillation Past Medical History:  Diagnosis Date  . Arthritis   . Blood transfusion without reported diagnosis   . Emphysema of lung (HCC)   . GERD (gastroesophageal reflux disease)   . Gout   .  Herpes simplex   . Hx of colonic polyps   . Hx of diverticulitis of colon   . Hyperglycemia   . Hypogonadism in male   . Hypothyroidism   . Lower urinary tract symptoms (LUTS)   . OSA (obstructive sleep apnea)   . Solitary lung nodule   . TIA (transient ischemic attack)     Past Surgical History:  Procedure Laterality Date  . HERNIA REPAIR Left 2018    Current Medications: Current Meds  Medication Sig   . alfuzosin (UROXATRAL) 10 MG 24 hr tablet Take 1 tablet (10 mg total) by mouth daily with breakfast.  . allopurinol (ZYLOPRIM) 300 MG tablet Take 1 tablet (300 mg total) by mouth daily.  Marland Kitchen. amiodarone (PACERONE) 200 MG tablet Take 1 tablet (200 mg) daily.  Marland Kitchen. apixaban (ELIQUIS) 5 MG TABS tablet Take 1 tablet (5 mg total) by mouth 2 (two) times daily.  . carvedilol (COREG) 6.25 MG tablet Take 1 tablet (6.25 mg total) by mouth 2 (two) times daily.  . clonazePAM (KLONOPIN) 1 MG tablet Take 1 tablet (1 mg total) by mouth daily.  Marland Kitchen. gabapentin (NEURONTIN) 300 MG capsule Take 300 mg by mouth as needed.  Marland Kitchen. levothyroxine (SYNTHROID, LEVOTHROID) 112 MCG tablet Take 1 tablet (112 mcg total) by mouth daily before breakfast.  . pantoprazole (PROTONIX) 20 MG tablet Take 1 tablet (20 mg total) by mouth 2 (two) times daily before a meal.  . simvastatin (ZOCOR) 40 MG tablet Take 1 tablet (40 mg total) by mouth daily.     Allergies:   Patient has no known allergies.   Social History   Socioeconomic History  . Marital status: Single    Spouse name: Not on file  . Number of children: 0  . Years of education: Not on file  . Highest education level: Not on file  Occupational History  . 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 Financial resource strain: Not on file  . Food insecurity:    Worry: Not on file    Inability: Not on file  . Transportation needs:    Medical: Not on file    Non-medical: Not on file  Tobacco Use  . Smoking status: Former Smoker    Last attempt to quit: 10/19/2012    Years since quitting: 5.5  . Smokeless tobacco: Never Used  . Tobacco comment: 1-2 ppd  Substance and Sexual Activity  . Alcohol use: Yes    Comment: Social- 2 drinks per day normally  . Drug use: Never  . Sexual activity: Not Currently  Lifestyle  . Physical activity:    Days per week: Not on file    Minutes per session: Not on file  . Stress: Not on file  Relationships  . Social connections:     Talks on phone: Not on file    Gets together: Not on file    Attends religious service: Not on file    Active member of club or organization: Not on file    Attends meetings of clubs or organizations: Not on file    Relationship status: Not on file  Other Topics Concern  . Not on file  Social History Narrative   [place]oved from [place] to [place], 09/2017   Sister is Samuel Frey      Family History: The patient's family history includes CAD in his maternal grandfather; COPD in his father; Scleroderma in his mother. There is no history of Colon cancer or Prostate cancer. ROS:   Please  see the history of present illness.    All other systems reviewed and are negative.  EKGs/Labs/Other Studies Reviewed:    The following studies were reviewed today:    Recent Labs: 10/19/2017: Hemoglobin 15.6; Platelets 126.0 04/01/2018: ALT 32; BUN 20; Creatinine, Ser 1.14; Potassium 4.5; Sodium 145; TSH 3.470  Recent Lipid Panel    Component Value Date/Time   CHOL 159 05/21/2017   TRIG 155 05/21/2017   HDL 47 05/21/2017   LDLCALC 81 05/21/2017    Physical Exam:    VS:  BP 106/68 (BP Location: Right Arm, Patient Position: Sitting, Cuff Size: Large)   Pulse 76   Ht 6' (1.829 m)   Wt 259 lb 1.9 oz (117.5 kg)   SpO2 95%   BMI 35.14 kg/m     Wt Readings from Last 3 Encounters:  04/26/18 259 lb 1.9 oz (117.5 kg)  03/10/18 259 lb 6.4 oz (117.7 kg)  03/02/18 266 lb 4 oz (120.8 kg)     GEN:  Well nourished, well developed in no acute distress HEENT: Normal NECK: No JVD; No carotid bruits LYMPHATICS: No lymphadenopathy CARDIAC: RRR, no murmurs, rubs, gallops RESPIRATORY:  Clear to auscultation without rales, wheezing or rhonchi  ABDOMEN: Soft, non-tender, non-distended MUSCULOSKELETAL:  No edema; No deformity  SKIN: Warm and dry NEUROLOGIC:  Alert and oriented x 3 PSYCHIATRIC:  Normal affect    Signed, Norman Herrlich, MD  04/26/2018 12:31 PM    [facility]

## 2018-04-26 ENCOUNTER — Encounter: Payer: Self-pay | Admitting: Cardiology

## 2018-04-26 ENCOUNTER — Ambulatory Visit (INDEPENDENT_AMBULATORY_CARE_PROVIDER_SITE_OTHER): Payer: Medicare Other | Admitting: Cardiology

## 2018-04-26 VITALS — BP 106/68 | HR 76 | Ht 72.0 in | Wt 259.1 lb

## 2018-04-26 DIAGNOSIS — I25118 Atherosclerotic heart disease of native coronary artery with other forms of angina pectoris: Secondary | ICD-10-CM | POA: Diagnosis not present

## 2018-04-26 DIAGNOSIS — I480 Paroxysmal atrial fibrillation: Secondary | ICD-10-CM | POA: Diagnosis not present

## 2018-04-26 DIAGNOSIS — I2510 Atherosclerotic heart disease of native coronary artery without angina pectoris: Secondary | ICD-10-CM | POA: Insufficient documentation

## 2018-04-26 DIAGNOSIS — G459 Transient cerebral ischemic attack, unspecified: Secondary | ICD-10-CM | POA: Diagnosis not present

## 2018-04-26 DIAGNOSIS — Z79899 Other long term (current) drug therapy: Secondary | ICD-10-CM

## 2018-04-26 DIAGNOSIS — E039 Hypothyroidism, unspecified: Secondary | ICD-10-CM

## 2018-04-26 NOTE — Patient Instructions (Signed)
Medication Instructions:  Your physician recommends that you continue on your current medications as directed. Please refer to the Current Medication list given to you today.  If you need a refill on your cardiac medications before your next appointment, please call your pharmacy.   Lab work: Your physician recommends that you return for lab work today: CMP and TSH     If you have labs (blood work) drawn today and your tests are completely normal, you will receive your results only by: Marland Kitchen MyChart Message (if you have MyChart) OR . A paper copy in the mail If you have any lab test that is abnormal or we need to change your treatment, we will call you to review the results.  Testing/Procedures: NONE  Follow-Up: At [facility], you and your health needs are our priority.  As part of our continuing mission to provide you with exceptional heart care, we have created designated Provider Care Teams.  These Care Teams include your primary Cardiologist (physician) and Advanced Practice Providers (APPs -  Physician Assistants and Nurse Practitioners) who all work together to provide you with the care you need, when you need it. You will need a follow up appointment in 3 months.  Please call our office 2 months in advance to schedule this appointment.  You may see No primary care provider on file.

## 2018-04-27 ENCOUNTER — Other Ambulatory Visit: Payer: Self-pay | Admitting: Cardiology

## 2018-04-27 ENCOUNTER — Encounter (INDEPENDENT_AMBULATORY_CARE_PROVIDER_SITE_OTHER): Payer: Self-pay | Admitting: Family Medicine

## 2018-04-27 LAB — COMPREHENSIVE METABOLIC PANEL
ALK PHOS: 86 IU/L (ref 39–117)
ALT: 31 IU/L (ref 0–44)
AST: 30 IU/L (ref 0–40)
Albumin/Globulin Ratio: 2 (ref 1.2–2.2)
Albumin: 4.5 g/dL (ref 3.7–4.7)
BUN/Creatinine Ratio: 14 (ref 10–24)
BUN: 17 mg/dL (ref 8–27)
Bilirubin Total: 0.7 mg/dL (ref 0.0–1.2)
CO2: 27 mmol/L (ref 20–29)
Calcium: 9.6 mg/dL (ref 8.6–10.2)
Chloride: 102 mmol/L (ref 96–106)
Creatinine, Ser: 1.22 mg/dL (ref 0.76–1.27)
GFR calc Af Amer: 67 mL/min/{1.73_m2} (ref >59)
GFR calc non Af Amer: 58 mL/min/{1.73_m2} — ABNORMAL LOW (ref >59)
Globulin, Total: 2.2 g/dL (ref 1.5–4.5)
Glucose: 92 mg/dL (ref 65–99)
Potassium: 4.3 mmol/L (ref 3.5–5.2)
Sodium: 142 mmol/L (ref 134–144)
Total Protein: 6.7 g/dL (ref 6.0–8.5)

## 2018-04-27 LAB — TSH: TSH: 3.24 u[IU]/mL (ref 0.450–4.500)

## 2018-05-01 ENCOUNTER — Encounter (INDEPENDENT_AMBULATORY_CARE_PROVIDER_SITE_OTHER): Payer: Self-pay | Admitting: Family Medicine

## 2018-05-12 ENCOUNTER — Encounter (INDEPENDENT_AMBULATORY_CARE_PROVIDER_SITE_OTHER): Payer: Self-pay | Admitting: Family Medicine

## 2018-05-20 ENCOUNTER — Telehealth: Payer: Self-pay | Admitting: Cardiology

## 2018-05-20 NOTE — Telephone Encounter (Signed)
Patient very upset that an FFR was performed with his cardiac ct on 04/08/18 without prior knowledge that there is an additional charge. Rn tried to explain reasoning and justification for procedure with Dr. Charm Rings advice. Patient is not happy and would like Dr.Munley to explain why he was not consulted prior to [facility] being performed.

## 2018-05-20 NOTE — Telephone Encounter (Signed)
Attempted to reach patient by phone, but there was no answer and no voicemail had been set up.  Will continue efforts.  

## 2018-05-20 NOTE — Telephone Encounter (Signed)
Patient called to ask about CT that was done. States he was billed for two CTs and when he talked to billing they said it was set up that way and to call us. Can you check into this on our end?

## 2018-05-20 NOTE — Telephone Encounter (Signed)
Patient returned call, he said to call back in an hour or so

## 2018-05-23 ENCOUNTER — Encounter (INDEPENDENT_AMBULATORY_CARE_PROVIDER_SITE_OTHER): Payer: Self-pay | Admitting: Family Medicine

## 2018-05-23 NOTE — Telephone Encounter (Signed)
I phoned him and explained the process of CCTA with CT CTA and FFR if needed. His concern was his billing out of pocker expense and not the approriateness  of technology. I do discuss FFR with the patient as an as needed if CTA is borderline significant at the visit before ordering the test.  He voiced understanding.

## 2018-05-24 ENCOUNTER — Ambulatory Visit (INDEPENDENT_AMBULATORY_CARE_PROVIDER_SITE_OTHER): Payer: Medicare Other | Admitting: Family Medicine

## 2018-05-31 ENCOUNTER — Ambulatory Visit: Payer: Medicare HMO | Admitting: Internal Medicine

## 2018-06-12 ENCOUNTER — Other Ambulatory Visit: Payer: Self-pay | Admitting: Internal Medicine

## 2018-07-05 ENCOUNTER — Encounter (INDEPENDENT_AMBULATORY_CARE_PROVIDER_SITE_OTHER): Payer: Self-pay | Admitting: Family Medicine

## 2018-07-05 DIAGNOSIS — R109 Unspecified abdominal pain: Secondary | ICD-10-CM

## 2018-07-06 NOTE — Addendum Note (Signed)
Addended by: Lillia Carmel on: 07/06/2018 10:45 AM   Modules accepted: Orders

## 2018-07-08 ENCOUNTER — Ambulatory Visit (INDEPENDENT_AMBULATORY_CARE_PROVIDER_SITE_OTHER): Payer: Medicare Other | Admitting: Family Medicine

## 2018-07-08 NOTE — Addendum Note (Signed)
Addended by: Elvina Mattes T on: 07/08/2018 09:55 AM   Modules accepted: Orders

## 2018-07-13 ENCOUNTER — Other Ambulatory Visit (INDEPENDENT_AMBULATORY_CARE_PROVIDER_SITE_OTHER): Payer: Self-pay | Admitting: Family Medicine

## 2018-07-13 ENCOUNTER — Telehealth: Payer: Self-pay

## 2018-07-13 MED ORDER — APIXABAN 5 MG PO TABS: 5.0000 mg | ORAL_TABLET | Freq: Two times a day (BID) | ORAL | 3 refills | Status: AC

## 2018-07-13 MED ORDER — APIXABAN 5 MG PO TABS: 5.0000 mg | ORAL_TABLET | Freq: Two times a day (BID) | ORAL | 3 refills | Status: DC

## 2018-07-13 MED ORDER — ALLOPURINOL 300 MG PO TABS: 300.0000 mg | ORAL_TABLET | Freq: Every day | ORAL | 3 refills | Status: DC

## 2018-07-13 NOTE — Telephone Encounter (Signed)
Patient sent patient advice request in MyChart, and he was very upset regarding his charges for Cardiac CTA that was done in Jan 2020.  Patient states that he is being billed for 2-3 CT scans, and he is not happy.  Explained to patient that he had Cardiac CTA with FFR, and that is probably the reason for the bill reading as it does.  Also, explained to patient that we are outpatient clinic and that we do not do billing for services completed at [facility].  Patient was not happy with any answers that I could give to him.  He began to raise his voice and stated, "this is not good for my blood pressure or my heart, and if needed I will contact a lawyer about this situation with intent to sue."  Apologies given to patient and told him the message would be relayed to [facility], Research officer, political party.  Patient then hung up on me. French Ana aware of the situation.

## 2018-07-15 ENCOUNTER — Telehealth: Payer: Self-pay | Admitting: *Deleted

## 2018-07-15 NOTE — Telephone Encounter (Signed)
Pt is extremely unhappy with the billing situation. Says he has talked to you on numerous occasions about this but cannot accept your explanation. Would like a call back about this.

## 2018-07-15 NOTE — Telephone Encounter (Signed)
Samuel Frey, I called himpreviously and spoke explained and told him that I cannot change his billing.

## 2018-07-24 ENCOUNTER — Encounter (INDEPENDENT_AMBULATORY_CARE_PROVIDER_SITE_OTHER): Payer: Self-pay | Admitting: Family Medicine

## 2018-07-25 ENCOUNTER — Other Ambulatory Visit: Payer: Self-pay | Admitting: Internal Medicine

## 2018-07-25 ENCOUNTER — Ambulatory Visit
Admission: RE | Admit: 2018-07-25 | Discharge: 2018-07-25 | Disposition: A | Discharge status: Home / Self Care | Payer: Medicare Other | Source: Ambulatory Visit | Attending: Family Medicine | Admitting: Family Medicine

## 2018-07-25 ENCOUNTER — Encounter (INDEPENDENT_AMBULATORY_CARE_PROVIDER_SITE_OTHER): Payer: Self-pay | Admitting: Family Medicine

## 2018-07-25 ENCOUNTER — Other Ambulatory Visit: Payer: Self-pay

## 2018-07-25 ENCOUNTER — Telehealth: Payer: Self-pay | Admitting: Family Medicine

## 2018-07-25 DIAGNOSIS — R109 Unspecified abdominal pain: Secondary | ICD-10-CM

## 2018-07-25 DIAGNOSIS — R1031 Right lower quadrant pain: Secondary | ICD-10-CM | POA: Diagnosis not present

## 2018-07-25 MED ORDER — IOPAMIDOL (ISOVUE-300) INJECTION 61%
125.0000 mL | Freq: Once | INTRAVENOUS | Status: AC | PRN
  Administered 2018-07-25: 125 mL via INTRAVENOUS

## 2018-07-25 NOTE — Telephone Encounter (Signed)
CT scan shows no sign of recurrent hernia.  There is diverticulosis in the colon, but it does not appear to be infected.  There is a right-sided kidney stone which is not obstructing any urinary flow so is probably not causing symptoms.

## 2018-07-26 ENCOUNTER — Encounter (INDEPENDENT_AMBULATORY_CARE_PROVIDER_SITE_OTHER): Payer: Self-pay | Admitting: Family Medicine

## 2018-07-26 ENCOUNTER — Other Ambulatory Visit: Payer: Self-pay | Admitting: Cardiology

## 2018-07-26 DIAGNOSIS — N50819 Testicular pain, unspecified: Secondary | ICD-10-CM

## 2018-07-27 ENCOUNTER — Other Ambulatory Visit: Payer: Self-pay | Admitting: Family Medicine

## 2018-07-28 ENCOUNTER — Other Ambulatory Visit: Payer: Self-pay | Admitting: Family Medicine

## 2018-07-28 MED ORDER — LEVOTHYROXINE SODIUM 112 MCG PO TABS: 112.0000 ug | ORAL_TABLET | Freq: Every day | ORAL | 1 refills | Status: DC

## 2018-08-07 ENCOUNTER — Encounter (INDEPENDENT_AMBULATORY_CARE_PROVIDER_SITE_OTHER): Payer: Self-pay | Admitting: Family Medicine

## 2018-08-11 ENCOUNTER — Ambulatory Visit: Payer: Medicare Other | Admitting: Family Medicine

## 2018-08-11 ENCOUNTER — Telehealth: Payer: Self-pay

## 2018-08-11 ENCOUNTER — Telehealth: Payer: Self-pay | Admitting: *Deleted

## 2018-08-11 NOTE — Telephone Encounter (Signed)
Pt called requesting records released to him. Gave pt number to [employer]: [phone]

## 2018-08-11 NOTE — Telephone Encounter (Signed)
Pt called to requesting records from visits with Dr. Dulce Sellar. Verbal request taken and faxed to [facility] office at [phone].

## 2018-08-16 ENCOUNTER — Other Ambulatory Visit: Payer: Self-pay | Admitting: Family Medicine

## 2018-08-16 MED ORDER — SIMVASTATIN 40 MG PO TABS: 40.0000 mg | ORAL_TABLET | Freq: Every day | ORAL | 1 refills | Status: AC

## 2018-08-18 ENCOUNTER — Encounter: Payer: Self-pay | Admitting: Family Medicine

## 2018-08-18 ENCOUNTER — Other Ambulatory Visit: Payer: Self-pay

## 2018-08-18 ENCOUNTER — Ambulatory Visit (INDEPENDENT_AMBULATORY_CARE_PROVIDER_SITE_OTHER): Payer: Medicare Other | Admitting: Family Medicine

## 2018-08-18 DIAGNOSIS — G8929 Other chronic pain: Secondary | ICD-10-CM

## 2018-08-18 DIAGNOSIS — M79671 Pain in right foot: Secondary | ICD-10-CM | POA: Diagnosis not present

## 2018-08-18 NOTE — Progress Notes (Signed)
Office Visit Note   Patient: Samuel RudeChristopher G Mausolf           Date of Birth: 1940-05-13           MRN: [id number] Visit Date: 08/18/2018 Requested by: Lavada MesiHilts, Isaid Salvia, MD [address] [place], [place] [address] PCP: Lavada MesiHilts, Leina Babe, MD  Subjective: Chief Complaint  Patient presents with  . Right Foot - Pain    Pain in lateral foot and heel.    HPI: He is here with recurrent right heel pain.  Injection helped quite a bit in December, but the pain is come back although not as bad as before.  In the past couple weeks he has been experiencing lateral foot pain as well.              ROS: No fevers or chills.  All other systems were reviewed and are negative.  Objective: Vital Signs: There were no vitals taken for this visit.  Physical Exam:  General:  Alert and oriented, in no acute distress. Pulm:  Breathing unlabored. Psy:  Normal mood, congruent affect. Skin: No rash on his skin. Right heel: Tender on the medial origin plantar fascia, less than before.  No significant calcaneal pain.  Mildly tender at the proximal fifth metatarsal.  Imaging: None today.  Assessment & Plan: 1.  Recurrent right heel pain due to plantar fasciitis.  Lateral foot pain probably from favoring his heel, cannot rule out metatarsal stress reaction. -Discussed with patient and elected to inject his heel one more time.  If his lateral foot pain persist we will obtain x-rays.  Follow-up as needed for this.     Procedures: Right plantar fascial injection: After sterile prep with Betadine, injected 3 cc 1% lidocaine without epinephrine and 40 mg methylprednisolone into the area of maximum tenderness at the medial origin plantar fascia.  Good relief during the anesthetic phase.    PMFS History: Patient Active Problem List   Diagnosis Date Noted  . CAD (coronary artery disease) 04/26/2018  . On amiodarone therapy 03/16/2018  . Chest pain in adult 03/10/2018  . Paroxysmal atrial fibrillation (HCC)  01/30/2018  . PCP notes >>>>>>>> 11/17/2017  . RLS (restless legs syndrome) 11/17/2017  . Hyperglycemia   . Emphysema of lung (HCC)   . TIA (transient ischemic attack)   . Hypothyroidism   . Lower urinary tract symptoms (LUTS)   . DJD , spinal stenosis   . Lung nodules 10/19/2017   Past Medical History:  Diagnosis Date  . Arthritis   . Blood transfusion without reported diagnosis   . Emphysema of lung (HCC)   . GERD (gastroesophageal reflux disease)   . Gout   . Herpes simplex   . Hx of colonic polyps   . Hx of diverticulitis of colon   . Hyperglycemia   . Hypogonadism in male   . Hypothyroidism   . Lower urinary tract symptoms (LUTS)   . OSA (obstructive sleep apnea)   . Solitary lung nodule   . TIA (transient ischemic attack)     Family History  Problem Relation Age of Onset  . COPD Father   . CAD Maternal Grandfather   . Scleroderma Mother   . Colon cancer Neg Hx   . Prostate cancer Neg Hx     Past Surgical History:  Procedure Laterality Date  . HERNIA REPAIR Left 2018   Social History   Occupational History  . 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 Smoking status: Former Smoker  Last attempt to quit: 10/19/2012    Years since quitting: 5.8  . Smokeless tobacco: Never Used  . Tobacco comment: 1-2 ppd  Substance and Sexual Activity  . Alcohol use: Yes    Comment: Social- 2 drinks per day normally  . Drug use: Never  . Sexual activity: Not Currently

## 2018-08-19 ENCOUNTER — Encounter: Payer: Self-pay | Admitting: Family Medicine

## 2018-08-23 ENCOUNTER — Other Ambulatory Visit: Payer: Self-pay | Admitting: Internal Medicine

## 2018-08-23 DIAGNOSIS — I350 Nonrheumatic aortic (valve) stenosis: Secondary | ICD-10-CM | POA: Diagnosis not present

## 2018-08-23 DIAGNOSIS — I25118 Atherosclerotic heart disease of native coronary artery with other forms of angina pectoris: Secondary | ICD-10-CM | POA: Diagnosis not present

## 2018-08-23 DIAGNOSIS — I480 Paroxysmal atrial fibrillation: Secondary | ICD-10-CM | POA: Diagnosis not present

## 2018-08-24 ENCOUNTER — Other Ambulatory Visit: Payer: Self-pay | Admitting: Family Medicine

## 2018-08-24 MED ORDER — ALFUZOSIN HCL ER 10 MG PO TB24: 10.0000 mg | ORAL_TABLET | Freq: Every day | ORAL | 1 refills | Status: DC

## 2018-08-30 ENCOUNTER — Other Ambulatory Visit: Payer: Self-pay | Admitting: Family Medicine

## 2018-08-30 ENCOUNTER — Encounter: Payer: Self-pay | Admitting: Family Medicine

## 2018-08-30 MED ORDER — TRIAMCINOLONE ACETONIDE 0.5 % EX CREA: 1.0000 "application " | TOPICAL_CREAM | Freq: Two times a day (BID) | CUTANEOUS | 2 refills | Status: AC | PRN

## 2018-08-30 MED ORDER — PANTOPRAZOLE SODIUM 20 MG PO TBEC: 20.0000 mg | DELAYED_RELEASE_TABLET | Freq: Two times a day (BID) | ORAL | 3 refills | Status: DC

## 2018-08-30 MED ORDER — FAMOTIDINE 20 MG PO TABS: 20.0000 mg | ORAL_TABLET | Freq: Two times a day (BID) | ORAL | 3 refills | Status: DC | PRN

## 2018-08-30 MED ORDER — GABAPENTIN 300 MG PO CAPS: 300.0000 mg | ORAL_CAPSULE | Freq: Three times a day (TID) | ORAL | 3 refills | Status: DC

## 2018-09-01 MED ORDER — DOXYCYCLINE HYCLATE 100 MG PO CAPS: 100.0000 mg | ORAL_CAPSULE | Freq: Two times a day (BID) | ORAL | 0 refills | Status: DC

## 2018-09-01 NOTE — Addendum Note (Signed)
Addended by: Hortencia Pilar on: 09/01/2018 03:10 PM   Modules accepted: Orders

## 2018-09-05 ENCOUNTER — Encounter: Payer: Self-pay | Admitting: Family Medicine

## 2018-09-07 DIAGNOSIS — N200 Calculus of kidney: Secondary | ICD-10-CM | POA: Diagnosis not present

## 2018-09-14 ENCOUNTER — Other Ambulatory Visit: Payer: Self-pay | Admitting: Urology

## 2018-09-19 DIAGNOSIS — R7989 Other specified abnormal findings of blood chemistry: Secondary | ICD-10-CM | POA: Diagnosis not present

## 2018-09-19 DIAGNOSIS — I10 Essential (primary) hypertension: Secondary | ICD-10-CM | POA: Diagnosis not present

## 2018-09-25 ENCOUNTER — Encounter: Payer: Self-pay | Admitting: Family Medicine

## 2018-09-25 DIAGNOSIS — R05 Cough: Secondary | ICD-10-CM

## 2018-09-25 DIAGNOSIS — R053 Chronic cough: Secondary | ICD-10-CM

## 2018-09-27 ENCOUNTER — Encounter: Payer: Self-pay | Admitting: Family Medicine

## 2018-09-27 MED ORDER — CLONAZEPAM 1 MG PO TABS: 1.0000 mg | ORAL_TABLET | Freq: Every day | ORAL | 3 refills | Status: DC

## 2018-09-29 ENCOUNTER — Encounter: Payer: Self-pay | Admitting: Family Medicine

## 2018-10-05 DIAGNOSIS — I480 Paroxysmal atrial fibrillation: Secondary | ICD-10-CM | POA: Diagnosis not present

## 2018-10-05 DIAGNOSIS — I25118 Atherosclerotic heart disease of native coronary artery with other forms of angina pectoris: Secondary | ICD-10-CM | POA: Diagnosis not present

## 2018-10-11 DIAGNOSIS — Z87891 Personal history of nicotine dependence: Secondary | ICD-10-CM | POA: Diagnosis not present

## 2018-10-11 DIAGNOSIS — R0600 Dyspnea, unspecified: Secondary | ICD-10-CM | POA: Diagnosis not present

## 2018-10-11 DIAGNOSIS — R911 Solitary pulmonary nodule: Secondary | ICD-10-CM | POA: Diagnosis not present

## 2018-10-21 ENCOUNTER — Encounter (HOSPITAL_BASED_OUTPATIENT_CLINIC_OR_DEPARTMENT_OTHER): Admission: RE | Payer: Self-pay | Source: Home / Self Care

## 2018-10-21 ENCOUNTER — Ambulatory Visit (HOSPITAL_BASED_OUTPATIENT_CLINIC_OR_DEPARTMENT_OTHER): Admission: RE | Admit: 2018-10-21 | Payer: Medicare Other | Source: Home / Self Care | Admitting: Urology

## 2018-10-21 SURGERY — HYDROCELECTOMY
Anesthesia: General | Laterality: Left

## 2018-10-24 ENCOUNTER — Other Ambulatory Visit: Payer: Self-pay | Admitting: Family Medicine

## 2018-10-24 DIAGNOSIS — I480 Paroxysmal atrial fibrillation: Secondary | ICD-10-CM

## 2018-10-24 MED ORDER — CARVEDILOL 6.25 MG PO TABS: 6.2500 mg | ORAL_TABLET | Freq: Two times a day (BID) | ORAL | 1 refills | Status: DC

## 2018-11-07 ENCOUNTER — Encounter: Payer: Self-pay | Admitting: Family Medicine

## 2018-11-07 MED ORDER — FAMOTIDINE 20 MG PO TABS: 20.0000 mg | ORAL_TABLET | Freq: Two times a day (BID) | ORAL | 3 refills | Status: AC | PRN

## 2018-11-17 ENCOUNTER — Telehealth: Payer: Self-pay | Admitting: Family Medicine

## 2018-11-17 NOTE — Telephone Encounter (Signed)
Please call pt to schedule appointment for his foot pain and to discuss recent info from [facility] [place].

## 2018-11-17 NOTE — Telephone Encounter (Signed)
Magda Paganini called and wanted you to check on patient because he scored passive SI said yes to question if life was not worth living and patient and answered yes.  Has there been a time in the last week that when you felt like you wish you were dead and patient answered yes.  If there is any question please call Leslie/[employer]-patient is part of a study and wants you to check on patient. [phone]

## 2018-11-18 NOTE — Telephone Encounter (Signed)
Ok, thanks.

## 2018-11-18 NOTE — Telephone Encounter (Signed)
FYI  I called the patient. His foot is feeling better, so he does not feel like he needs to be seen for this at this time. I did ask him about his depression symptoms. He said he is having a case of home sickness for [place] right now. The humidity in [place] is "bad for my lungs." He said he is looking into possibly getting a summer rental in the [place] for the future. I asked him if he needed to talk with Dr. Junius Roads about his depression symptoms. He said he will just continue with the [employer] study with Magda Paganini and "work through it." I reiterated that he can reach out to [place] if he needs [place]. He said it was a "comfort to know that."

## 2018-11-19 ENCOUNTER — Other Ambulatory Visit: Payer: Self-pay | Admitting: Family Medicine

## 2018-11-28 ENCOUNTER — Other Ambulatory Visit: Payer: Self-pay | Admitting: Family Medicine

## 2018-12-07 DIAGNOSIS — R0600 Dyspnea, unspecified: Secondary | ICD-10-CM | POA: Diagnosis not present

## 2018-12-07 DIAGNOSIS — J309 Allergic rhinitis, unspecified: Secondary | ICD-10-CM | POA: Diagnosis not present

## 2018-12-07 DIAGNOSIS — R05 Cough: Secondary | ICD-10-CM | POA: Diagnosis not present

## 2018-12-14 DIAGNOSIS — R918 Other nonspecific abnormal finding of lung field: Secondary | ICD-10-CM | POA: Diagnosis not present

## 2018-12-14 DIAGNOSIS — I2510 Atherosclerotic heart disease of native coronary artery without angina pectoris: Secondary | ICD-10-CM | POA: Diagnosis not present

## 2018-12-14 DIAGNOSIS — R0600 Dyspnea, unspecified: Secondary | ICD-10-CM | POA: Diagnosis not present

## 2018-12-26 ENCOUNTER — Other Ambulatory Visit: Payer: Self-pay | Admitting: Family Medicine

## 2018-12-26 MED ORDER — GABAPENTIN 300 MG PO CAPS: 300.0000 mg | ORAL_CAPSULE | Freq: Three times a day (TID) | ORAL | 3 refills | Status: DC

## 2018-12-30 ENCOUNTER — Telehealth: Payer: Self-pay | Admitting: Family Medicine

## 2018-12-30 MED ORDER — GABAPENTIN 400 MG PO CAPS: 400.0000 mg | ORAL_CAPSULE | Freq: Three times a day (TID) | ORAL | 3 refills | Status: DC

## 2018-12-30 NOTE — Telephone Encounter (Signed)
Patient called advised the Rx for Gabapentin should have been filled for a higher dosage of 400 mg instead of 300 mg. [facility] [place] The number to contact patient is [phone]

## 2018-12-30 NOTE — Telephone Encounter (Signed)
Sent!

## 2018-12-30 NOTE — Telephone Encounter (Signed)
See below

## 2019-01-01 ENCOUNTER — Encounter: Payer: Self-pay | Admitting: Family Medicine

## 2019-01-03 DIAGNOSIS — R0683 Snoring: Secondary | ICD-10-CM | POA: Diagnosis not present

## 2019-01-03 DIAGNOSIS — R911 Solitary pulmonary nodule: Secondary | ICD-10-CM | POA: Diagnosis not present

## 2019-01-03 DIAGNOSIS — R05 Cough: Secondary | ICD-10-CM | POA: Diagnosis not present

## 2019-01-18 ENCOUNTER — Other Ambulatory Visit: Payer: Self-pay | Admitting: Family Medicine

## 2019-01-19 ENCOUNTER — Other Ambulatory Visit: Payer: Self-pay | Admitting: Family Medicine

## 2019-01-24 ENCOUNTER — Telehealth: Payer: Self-pay | Admitting: Family Medicine

## 2019-01-24 NOTE — Telephone Encounter (Signed)
Patient called. Left a message on the voicemail concerning results from MRI. His call back number is [phone]

## 2019-01-24 NOTE — Telephone Encounter (Signed)
I called the patient to get clarification on this message. He said he has been trying to get through to someone to schedule an appointment for his left knee pain x 1 week. I scheduled an appointment for him tomorrow morning with Dr. Junius Roads at 9:00.

## 2019-01-25 ENCOUNTER — Ambulatory Visit (INDEPENDENT_AMBULATORY_CARE_PROVIDER_SITE_OTHER): Payer: Medicare Other | Admitting: Family Medicine

## 2019-01-25 ENCOUNTER — Encounter: Payer: Self-pay | Admitting: Family Medicine

## 2019-01-25 ENCOUNTER — Other Ambulatory Visit: Payer: Self-pay

## 2019-01-25 DIAGNOSIS — M25562 Pain in left knee: Secondary | ICD-10-CM | POA: Diagnosis not present

## 2019-01-25 DIAGNOSIS — L989 Disorder of the skin and subcutaneous tissue, unspecified: Secondary | ICD-10-CM

## 2019-01-25 MED ORDER — DICLOFENAC SODIUM 1 % TD GEL: 4.0000 g | Freq: Four times a day (QID) | TRANSDERMAL | 6 refills | Status: AC | PRN

## 2019-01-25 NOTE — Progress Notes (Signed)
Office Visit Note   Patient: Samuel Frey           Date of Birth: 1940-09-01           MRN: [id number] Visit Date: 01/25/2019 Requested by: Lavada Mesi, MD [address] [place],  [place] [address] PCP: Lavada Mesi, MD  Subjective: Chief Complaint  Patient presents with  . Left Knee - Pain    Pain x 1 week, NKI. Just woke up 1 morning and almost fell due to the pain. He said he has known meniscus tears in both knees. It has not bothered him until now. Walking with aid of cane today.    HPI: He is here with left knee pain.  Symptoms started 1 week ago, no injury.  He woke up 1 morning and had pain and stiffness in his knee.  He has a known history of meniscus tears in both of his knees which have not required surgery and have not really bother him that much.  His pain was 10/10 initially, but is now closer to 3/10.  He has a history of gout but it has never affected his knee.  He has trouble flexing his knee but it is not walking.  He also asked me to look at a skin lesion on his left shoulder.               ROS: No fevers or chills.  All other systems were reviewed and are negative.  Objective: Vital Signs: There were no vitals taken for this visit.  Physical Exam:  General:  Alert and oriented, in no acute distress. Pulm:  Breathing unlabored. Psy:  Normal mood, congruent affect. Skin: He has multiple seborrheic keratoses on his back, and the one on his left shoulder has a scab from patient scratching it. Left knee: Slight warmth, no erythema.  1+ effusion.  He can only flex about 45 degrees before pain limits further bending.  Tender on the medial and lateral joint lines.  Imaging: None today.  Assessment & Plan: 1.  Left knee pain, suspicious for gout.  Could be recurrent pain from known meniscus tear. -He is clinically improving.  We will try ice applications and Voltaren gel.  Steroid injection if symptoms persist.  2.  Seborrheic keratoses on his back  and left shoulder -Reassurance, dermatology consult if he has worsening or high desires treatment.     Procedures: No procedures performed  No notes on file     PMFS History: Patient Active Problem List   Diagnosis Date Noted  . CAD (coronary artery disease) 04/26/2018  . On amiodarone therapy 03/16/2018  . Chest pain in adult 03/10/2018  . Paroxysmal atrial fibrillation (HCC) 01/30/2018  . PCP notes >>>>>>>> 11/17/2017  . RLS (restless legs syndrome) 11/17/2017  . Hyperglycemia   . Emphysema of lung (HCC)   . TIA (transient ischemic attack)   . Hypothyroidism   . Lower urinary tract symptoms (LUTS)   . DJD , spinal stenosis   . Lung nodules 10/19/2017   Past Medical History:  Diagnosis Date  . Arthritis   . Blood transfusion without reported diagnosis   . Emphysema of lung (HCC)   . GERD (gastroesophageal reflux disease)   . Gout   . Herpes simplex   . Hx of colonic polyps   . Hx of diverticulitis of colon   . Hyperglycemia   . Hypogonadism in male   . Hypothyroidism   . Lower urinary tract symptoms (LUTS)   .  OSA (obstructive sleep apnea)   . Solitary lung nodule   . TIA (transient ischemic attack)     Family History  Problem Relation Age of Onset  . COPD Father   . CAD Maternal Grandfather   . Scleroderma Mother   . Colon cancer Neg Hx   . Prostate cancer Neg Hx     Past Surgical History:  Procedure Laterality Date  . HERNIA REPAIR Left 2018   Social History   Occupational History  . Occupation: retired, Pharmacologist  Tobacco Use  . Smoking status: Former Smoker    Quit date: 10/19/2012    Years since quitting: 6.2  . Smokeless tobacco: Never Used  . Tobacco comment: 1-2 ppd  Substance and Sexual Activity  . Alcohol use: Yes    Comment: Social- 2 drinks per day normally  . Drug use: Never  . Sexual activity: Not Currently

## 2019-02-01 ENCOUNTER — Encounter: Payer: Self-pay | Admitting: Family Medicine

## 2019-02-01 ENCOUNTER — Ambulatory Visit (INDEPENDENT_AMBULATORY_CARE_PROVIDER_SITE_OTHER): Payer: Medicare Other | Admitting: Family Medicine

## 2019-02-01 ENCOUNTER — Other Ambulatory Visit: Payer: Self-pay

## 2019-02-01 DIAGNOSIS — M25562 Pain in left knee: Secondary | ICD-10-CM

## 2019-02-01 NOTE — Progress Notes (Signed)
Office Visit Note   Patient: Samuel Frey           Date of Birth: 06-Dec-1940           MRN: [id number] Visit Date: 02/01/2019 Requested by: Eunice Blase, MD [address] [place],  [place] [address] PCP: Eunice Blase, MD  Subjective: Chief Complaint  Patient presents with  . Left Knee - Pain    HPI: He is here with persistent left knee pain.  No improvement since last visit, continues to complain of pain on the medial and lateral aspect.  It feels stiff when he tries to bend it.  Voltaren gel is not helping.  No locking or giving way.              ROS: No fevers or chills.  All other systems were reviewed and are negative.  Objective: Vital Signs: There were no vitals taken for this visit.  Physical Exam:  General:  Alert and oriented, in no acute distress. Pulm:  Breathing unlabored. Psy:  Normal mood, congruent affect. Skin: No erythema or rash. Left knee: 1-2+ effusion with no warmth.  Tender on the medial and lateral joint lines.  Full active extension, flexion of about 120 degrees.  Imaging: None today.  Assessment & Plan: 1.  Left knee effusion with known history of meniscus tear -Discussed options with him and elected to inject with cortisone today.  If he fails to get relief, then x-rays and possibly a new MRI scan, although he really wants to avoid surgery if at all possible.     Procedures: Left knee steroid injection: After sterile prep with Betadine, injected 3 cc 1% lidocaine without epinephrine and 40 mg methylprednisolone from superolateral approach, a flash of clear yellow synovial fluid was obtained prior to injection confirming intra-articular placement.    PMFS History: Patient Active Problem List   Diagnosis Date Noted  . CAD (coronary artery disease) 04/26/2018  . On amiodarone therapy 03/16/2018  . Chest pain in adult 03/10/2018  . Paroxysmal atrial fibrillation ([place]) 01/30/2018  . PCP notes >>>>>>>> 11/17/2017  . RLS  (restless legs syndrome) 11/17/2017  . Hyperglycemia   . Emphysema of lung ([place])   . TIA (transient ischemic attack)   . Hypothyroidism   . Lower urinary tract symptoms (LUTS)   . DJD , spinal stenosis   . Lung nodules 10/19/2017   Past Medical History:  Diagnosis Date  . Arthritis   . Blood transfusion without reported diagnosis   . Emphysema of lung ([place])   . GERD (gastroesophageal reflux disease)   . Gout   . Herpes simplex   . Hx of colonic polyps   . Hx of diverticulitis of colon   . Hyperglycemia   . Hypogonadism in male   . Hypothyroidism   . Lower urinary tract symptoms (LUTS)   . OSA (obstructive sleep apnea)   . Solitary lung nodule   . TIA (transient ischemic attack)     Family History  Problem Relation Age of Onset  . COPD Father   . CAD Maternal Grandfather   . Scleroderma Mother   . Colon cancer Neg Hx   . Prostate cancer Neg Hx     Past Surgical History:  Procedure Laterality Date  . HERNIA REPAIR Left 2018   Social History   Occupational History  . Occupation: retired, Pharmacologist  Tobacco Use  . Smoking status: Former Smoker    Quit date: 10/19/2012    Years since quitting: 6.2  .  Smokeless tobacco: Never Used  . Tobacco comment: 1-2 ppd  Substance and Sexual Activity  . Alcohol use: Yes    Comment: Social- 2 drinks per day normally  . Drug use: Never  . Sexual activity: Not Currently

## 2019-02-13 ENCOUNTER — Encounter: Payer: Self-pay | Admitting: Family Medicine

## 2019-02-17 ENCOUNTER — Encounter: Payer: Self-pay | Admitting: Family Medicine

## 2019-02-20 ENCOUNTER — Telehealth: Payer: Self-pay

## 2019-02-20 NOTE — Telephone Encounter (Signed)
Submitted VOB for Durolane, left knee. °

## 2019-02-22 ENCOUNTER — Encounter: Payer: Self-pay | Admitting: Family Medicine

## 2019-02-24 DIAGNOSIS — I480 Paroxysmal atrial fibrillation: Secondary | ICD-10-CM | POA: Diagnosis not present

## 2019-02-24 DIAGNOSIS — I25118 Atherosclerotic heart disease of native coronary artery with other forms of angina pectoris: Secondary | ICD-10-CM | POA: Diagnosis not present

## 2019-02-28 ENCOUNTER — Telehealth: Payer: Self-pay

## 2019-02-28 ENCOUNTER — Encounter: Payer: Self-pay | Admitting: Family Medicine

## 2019-02-28 NOTE — Telephone Encounter (Signed)
Patient aware that he is approved for gel injection.  Approved for Durolane, left knee. [place] Patient will be responsible for 20% OOP. Co-pay of $35.00 No PA required  Appt. 03/07/2019 with Dr. Junius Roads

## 2019-03-02 ENCOUNTER — Encounter: Payer: Self-pay | Admitting: Family Medicine

## 2019-03-07 ENCOUNTER — Ambulatory Visit: Payer: Medicare Other | Admitting: Family Medicine

## 2019-03-12 ENCOUNTER — Other Ambulatory Visit: Payer: Self-pay | Admitting: Family Medicine

## 2019-03-13 ENCOUNTER — Encounter: Payer: Self-pay | Admitting: Family Medicine

## 2019-03-14 ENCOUNTER — Ambulatory Visit: Payer: Medicare Other | Admitting: Family Medicine

## 2019-03-21 ENCOUNTER — Telehealth: Payer: Self-pay | Admitting: Family Medicine

## 2019-03-21 ENCOUNTER — Encounter: Payer: Self-pay | Admitting: Family Medicine

## 2019-03-21 ENCOUNTER — Telehealth: Payer: Self-pay | Admitting: Orthopedic Surgery

## 2019-03-21 NOTE — Telephone Encounter (Signed)
I called and spoke with the patient. There is still an issue between [facility] credentialing dept and [employer]. Drinda Butts in our insurance department has reached out to both regarding our practice not being on the list of providers. She has yet to hear anything on a resolution for this. I advised the patient I will keep a check on this and let him know as soon I I know more.

## 2019-03-21 NOTE — Telephone Encounter (Signed)
I called and spoke with the patient - see other message on this from today. 

## 2019-03-21 NOTE — Telephone Encounter (Signed)
Returning Samuel Frey's phone call.  He states they are working on some "insurance issues" and seem to be playing phone tag.  Advised that we tend to call in between patients or at the end of clinic.

## 2019-03-21 NOTE — Telephone Encounter (Signed)
Patient called and requesting a call back from [facility]. Patient phone number is [phone].

## 2019-03-21 NOTE — Telephone Encounter (Signed)
I called the patient and reached his voice mail - message left to call me back.

## 2019-03-24 ENCOUNTER — Encounter: Payer: Self-pay | Admitting: Family Medicine

## 2019-03-27 ENCOUNTER — Ambulatory Visit: Payer: Medicare Other | Attending: Internal Medicine

## 2019-03-27 DIAGNOSIS — Z23 Encounter for immunization: Secondary | ICD-10-CM

## 2019-03-27 NOTE — Progress Notes (Signed)
   Covid-19 Vaccination Clinic  Name:  Samuel Frey    MRN: [id number] DOB: 1940-11-06  03/27/2019  [name] was observed post Covid-19 immunization for 30 minutes based on pre-vaccination screening without incidence. He was provided with Vaccine Information Sheet and instruction to access the V-Safe system.   [name] was instructed to call 911 with any severe reactions post vaccine: Marland Kitchen Difficulty breathing  . Swelling of your face and throat  . A fast heartbeat  . A bad rash all over your body  . Dizziness and weakness

## 2019-04-08 ENCOUNTER — Other Ambulatory Visit: Payer: Self-pay | Admitting: Family Medicine

## 2019-04-08 DIAGNOSIS — I480 Paroxysmal atrial fibrillation: Secondary | ICD-10-CM

## 2019-04-10 ENCOUNTER — Encounter: Payer: Self-pay | Admitting: Family Medicine

## 2019-04-10 MED ORDER — ALFUZOSIN HCL ER 10 MG PO TB24: 10.0000 mg | ORAL_TABLET | Freq: Every day | ORAL | 1 refills | Status: AC

## 2019-04-11 MED ORDER — PANTOPRAZOLE SODIUM 20 MG PO TBEC: 20.0000 mg | DELAYED_RELEASE_TABLET | Freq: Every day | ORAL | 3 refills | Status: DC

## 2019-04-11 NOTE — Addendum Note (Signed)
Addended by: Lillia Carmel on: 04/11/2019 07:59 AM   Modules accepted: Orders

## 2019-04-16 ENCOUNTER — Ambulatory Visit: Payer: Medicare HMO

## 2019-04-17 ENCOUNTER — Ambulatory Visit: Payer: Medicare HMO

## 2019-04-19 ENCOUNTER — Ambulatory Visit: Payer: Medicare HMO

## 2019-04-20 ENCOUNTER — Ambulatory Visit: Payer: Medicare HMO | Attending: Internal Medicine

## 2019-04-20 DIAGNOSIS — Z23 Encounter for immunization: Secondary | ICD-10-CM | POA: Insufficient documentation

## 2019-04-20 NOTE — Progress Notes (Signed)
   Covid-19 Vaccination Clinic  Name:  Samuel Frey    MRN: [id number] DOB: 05-06-1940  04/20/2019  Samuel Frey was observed post Covid-19 immunization for 15 minutes without incidence. He was provided with Vaccine Information Sheet and instruction to access the V-Safe system.   Samuel Frey was instructed to call 911 with any severe reactions post vaccine: Marland Kitchen Difficulty breathing  . Swelling of your face and throat  . A fast heartbeat  . A bad rash all over your body  . Dizziness and weakness    Immunizations Administered    Name Date Dose VIS Date Route   Pfizer COVID-19 Vaccine 04/20/2019 11:13 AM 0.3 mL 02/24/2019 Intramuscular   Manufacturer: [employer], [employer]   Lot: [id number]   NDC: [address]-1

## 2019-05-17 ENCOUNTER — Other Ambulatory Visit: Payer: Self-pay | Admitting: Family Medicine

## 2019-05-18 ENCOUNTER — Encounter: Payer: Self-pay | Admitting: Family Medicine

## 2019-08-11 ENCOUNTER — Other Ambulatory Visit (INDEPENDENT_AMBULATORY_CARE_PROVIDER_SITE_OTHER): Payer: Self-pay | Admitting: Family Medicine

## 2019-08-30 ENCOUNTER — Telehealth: Payer: Self-pay

## 2019-08-30 MED ORDER — LEVOTHYROXINE SODIUM 112 MCG PO TABS: 112.0000 ug | ORAL_TABLET | Freq: Every day | ORAL | 1 refills | Status: DC

## 2019-08-30 NOTE — Telephone Encounter (Signed)
LMOM for patient that we sent in medications and to call soon for a lab draw appointment

## 2019-08-30 NOTE — Addendum Note (Signed)
Addended by: Lillia Carmel on: 08/30/2019 04:44 PM   Modules accepted: Orders

## 2019-08-30 NOTE — Telephone Encounter (Signed)
Patient requests refill of Levothyroxine refill [facility]

## 2019-08-30 NOTE — Telephone Encounter (Signed)
Rx sent.  Due for thyroid labs soon.

## 2019-08-31 ENCOUNTER — Other Ambulatory Visit: Payer: Self-pay | Admitting: Family Medicine

## 2019-08-31 DIAGNOSIS — I480 Paroxysmal atrial fibrillation: Secondary | ICD-10-CM

## 2020-02-23 ENCOUNTER — Other Ambulatory Visit: Payer: Self-pay | Admitting: Family Medicine

## 2020-04-29 ENCOUNTER — Other Ambulatory Visit: Payer: Self-pay | Admitting: Family Medicine

## 2020-05-21 ENCOUNTER — Other Ambulatory Visit: Payer: Self-pay | Admitting: Family Medicine

## 2021-04-11 IMAGING — CT CT ABDOMEN AND PELVIS WITH CONTRAST
1 of 3 series · 13 of 32 positions shown, 19 images · IV contrast (APPLIED)
Comparison: None.

CLINICAL DATA: Left inguinal hernia repair, discomfort and
testicular swelling, right lower quadrant pain and tenderness, right
PICC Romaine pain

EXAM:
CT ABDOMEN AND PELVIS WITH CONTRAST
TECHNIQUE: Multidetector CT imaging of the abdomen and pelvis was performed
using the standard protocol following bolus administration of
intravenous contrast.
CONTRAST:  125mL [id number] IOPAMIDOL ([id number]) INJECTION 61%

[Series 2: abd/pelvis w/cm · axial · 0.98mm/px · z∈[-498,-24]mm · 13 of 111 slices shown, 19 images]
[im 8/111  soft-tissue]
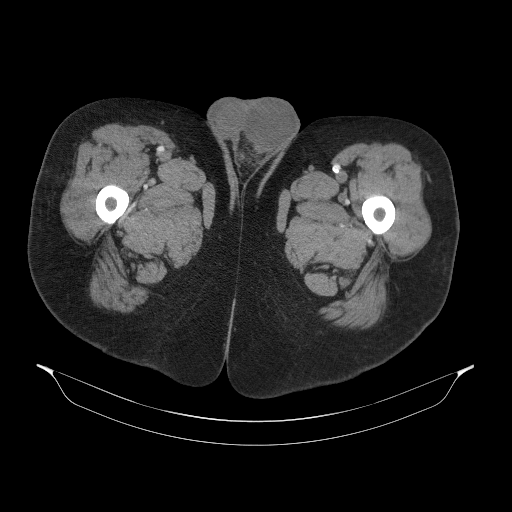
[im 8/111  bone]
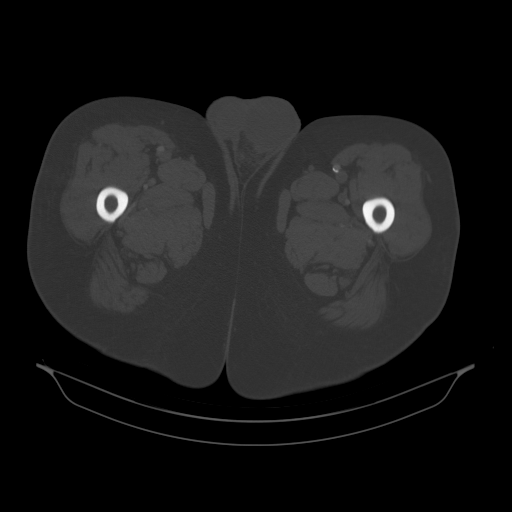
[im 15/111  soft-tissue]
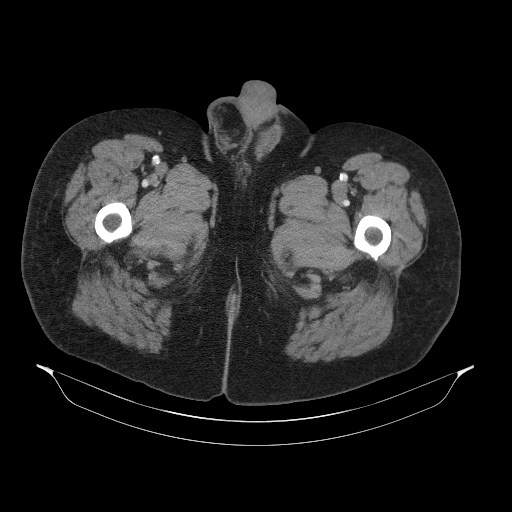
[im 23/111  soft-tissue]
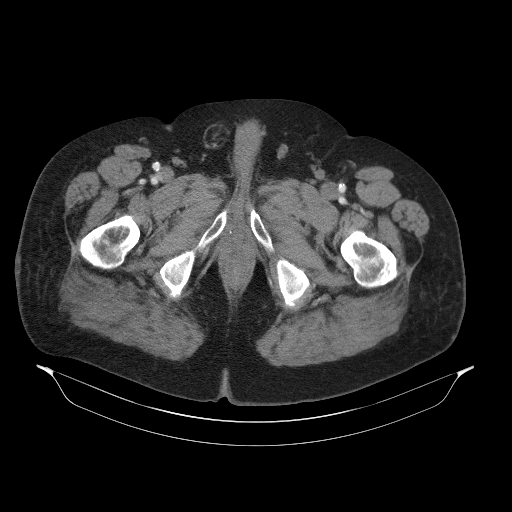
[im 30/111  soft-tissue]
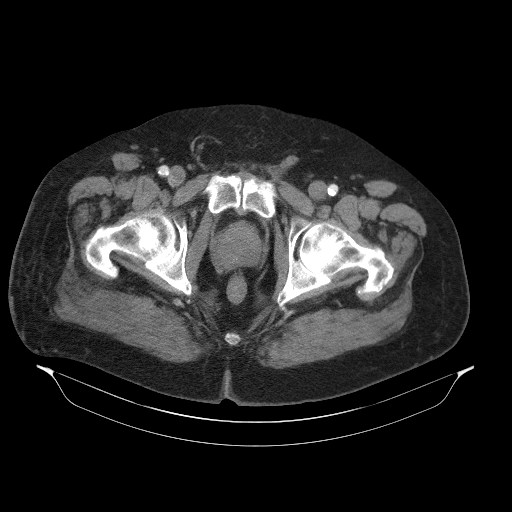
[im 37/111  soft-tissue]
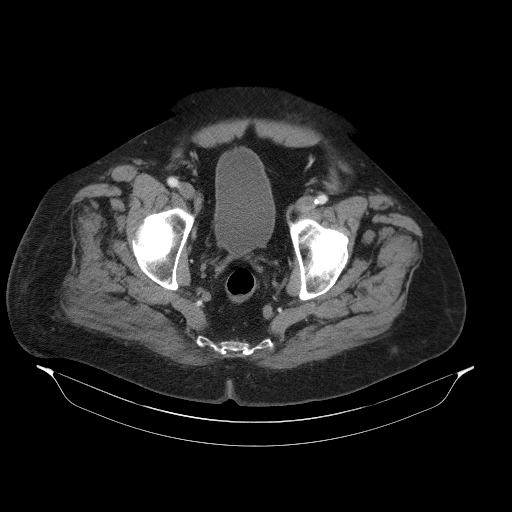
[im 45/111  soft-tissue]
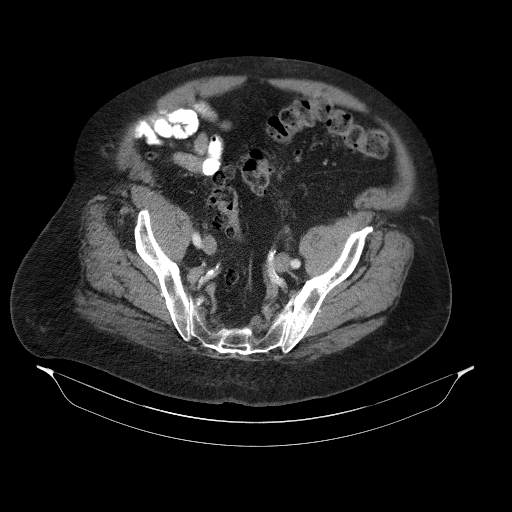
[im 59/111  soft-tissue]
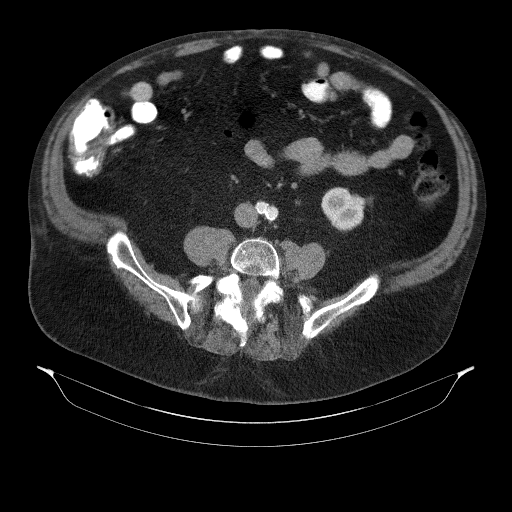
[im 67/111  soft-tissue]
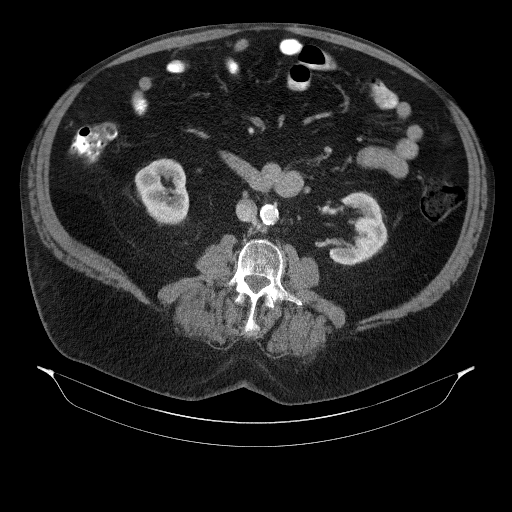
[im 74/111  soft-tissue]
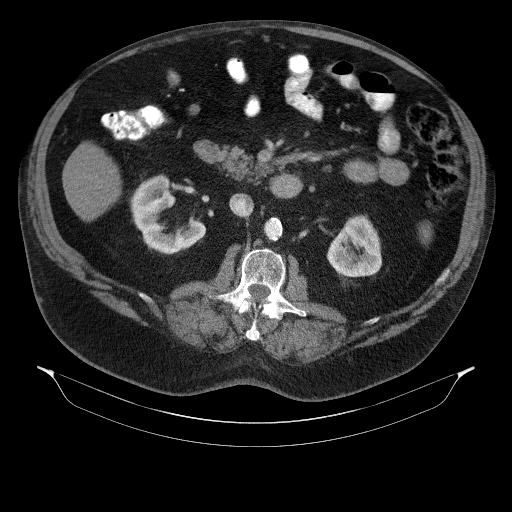
[im 74/111  bone]
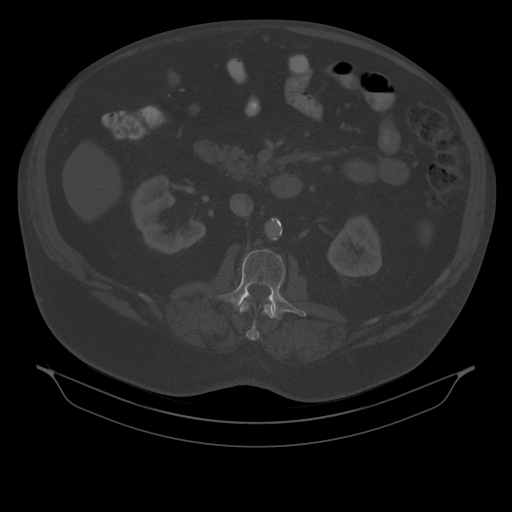
[im 81/111  soft-tissue]
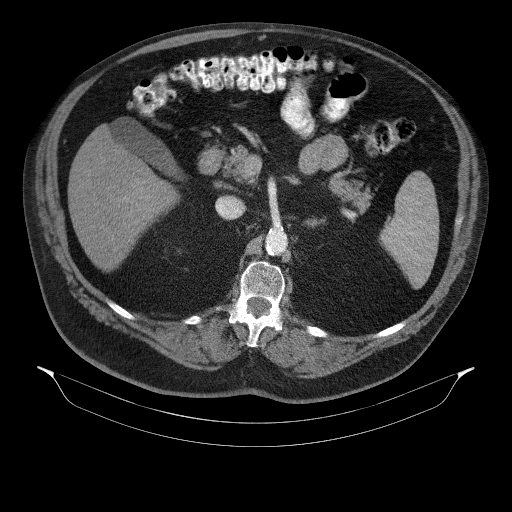
[im 81/111  lung]
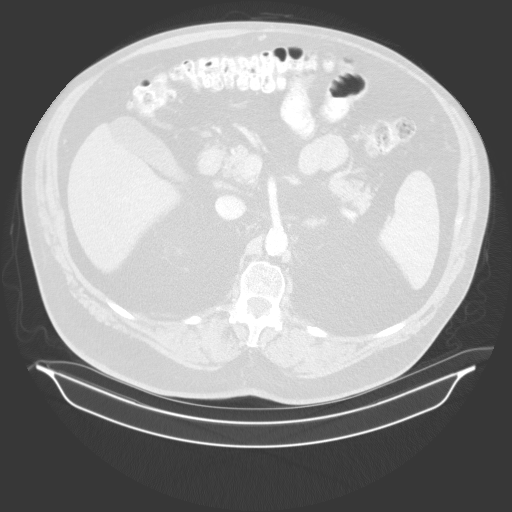
[im 89/111  soft-tissue]
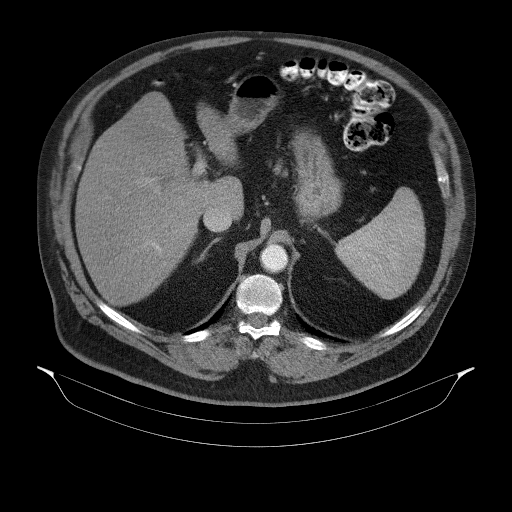
[im 89/111  lung]
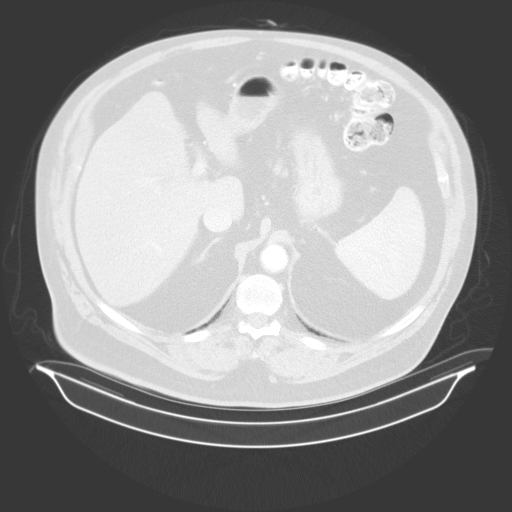
[im 96/111  soft-tissue]
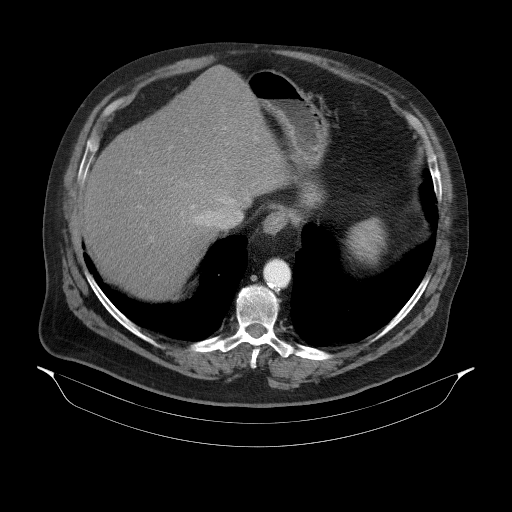
[im 96/111  lung]
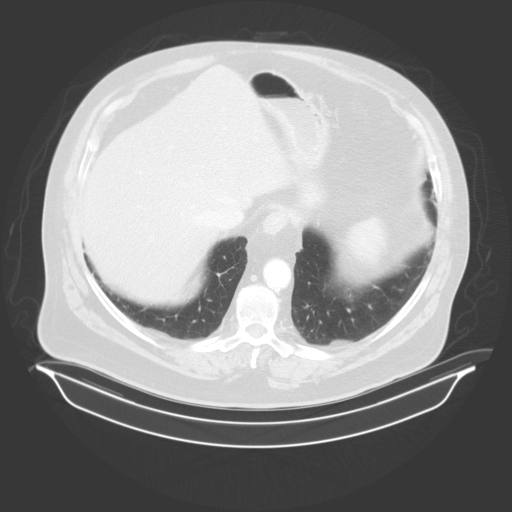
[im 103/111  soft-tissue]
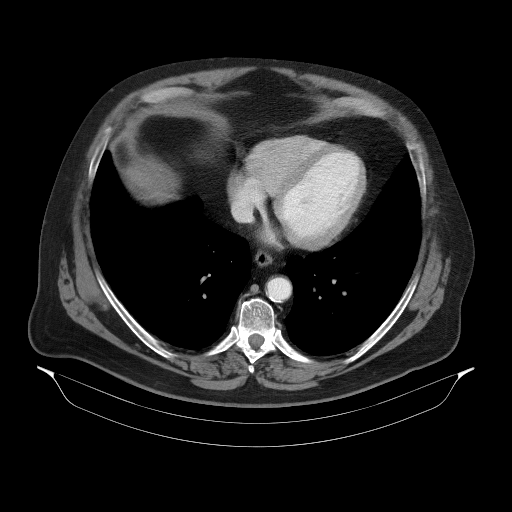
[im 103/111  lung]
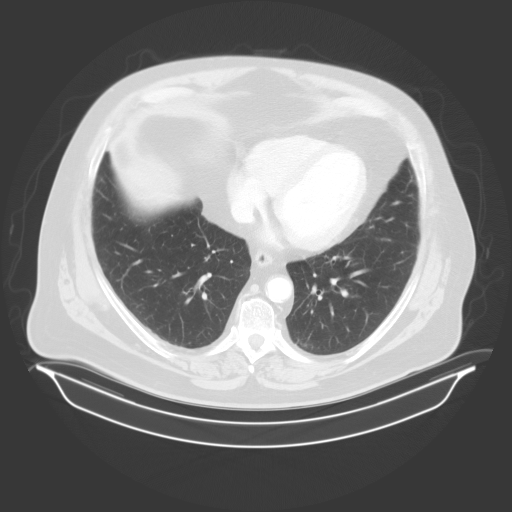

[13 of 32 positions shown; findings below may reference images not displayed]

FINDINGS: Lower chest: No acute abnormality.

Hepatobiliary: No focal liver abnormality is seen. No gallstones,
gallbladder wall thickening, or biliary dilatation.

Pancreas: Unremarkable. No pancreatic ductal dilatation or
surrounding inflammatory changes.

Spleen: Normal in size without focal abnormality.

Adrenals/Urinary Tract: Adrenal glands are unremarkable. Small
nonobstructive right-sided renal calculi. No other evidence of
urinary tract calculus. No hydronephrosis. Bladder is unremarkable.

Stomach/Bowel: Stomach is within normal limits. Appendix appears
normal. No evidence of bowel wall thickening, distention, or
inflammatory changes. Sigmoid diverticulosis.

Vascular/Lymphatic: Calcific atherosclerosis. No enlarged abdominal
or pelvic lymph nodes.

Reproductive: No mass or other abnormality.

Other: Evidence of prior left inguinal hernia repair. No evidence of
recurrent hernia. Small, fat containing right inguinal hernia. No
abdominopelvic ascites.

Musculoskeletal: No acute or significant osseous findings.
IMPRESSION: 1. Evidence of prior left inguinal hernia repair. No evidence of
recurrent hernia. Small, fat containing right inguinal hernia.

2.  Sigmoid diverticulosis without evidence of acute diverticulitis.

3.  Nonobstructive right nephrolithiasis.
